# Patient Record
Sex: Female | Born: 1990 | Race: White | Hispanic: No | Marital: Single | Smoking: Current every day smoker
Health system: Southern US, Community
[De-identification: ages and names within clinical notes are randomized; demographics above are authoritative.]

## PROBLEM LIST (undated history)

## (undated) DIAGNOSIS — R7303 Prediabetes: Secondary | ICD-10-CM

## (undated) DIAGNOSIS — R12 Heartburn: Secondary | ICD-10-CM

## (undated) DIAGNOSIS — Z8619 Personal history of other infectious and parasitic diseases: Secondary | ICD-10-CM

## (undated) DIAGNOSIS — F32A Depression, unspecified: Secondary | ICD-10-CM

## (undated) HISTORY — DX: Prediabetes: R73.03

## (undated) HISTORY — DX: Heartburn: R12

## (undated) HISTORY — DX: Personal history of other infectious and parasitic diseases: Z86.19

## (undated) HISTORY — DX: Depression, unspecified: F32.A

---
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
## 2012-06-15 IMAGING — CR DG FOOT COMPLETE 3+V*R*
3 series · 3 of 3 positions shown · non-contrast
Comparison: None.

CLINICAL DATA: Stepped on something, pain

RIGHT FOOT COMPLETE - 3+ VIEW

[t foot ap right]
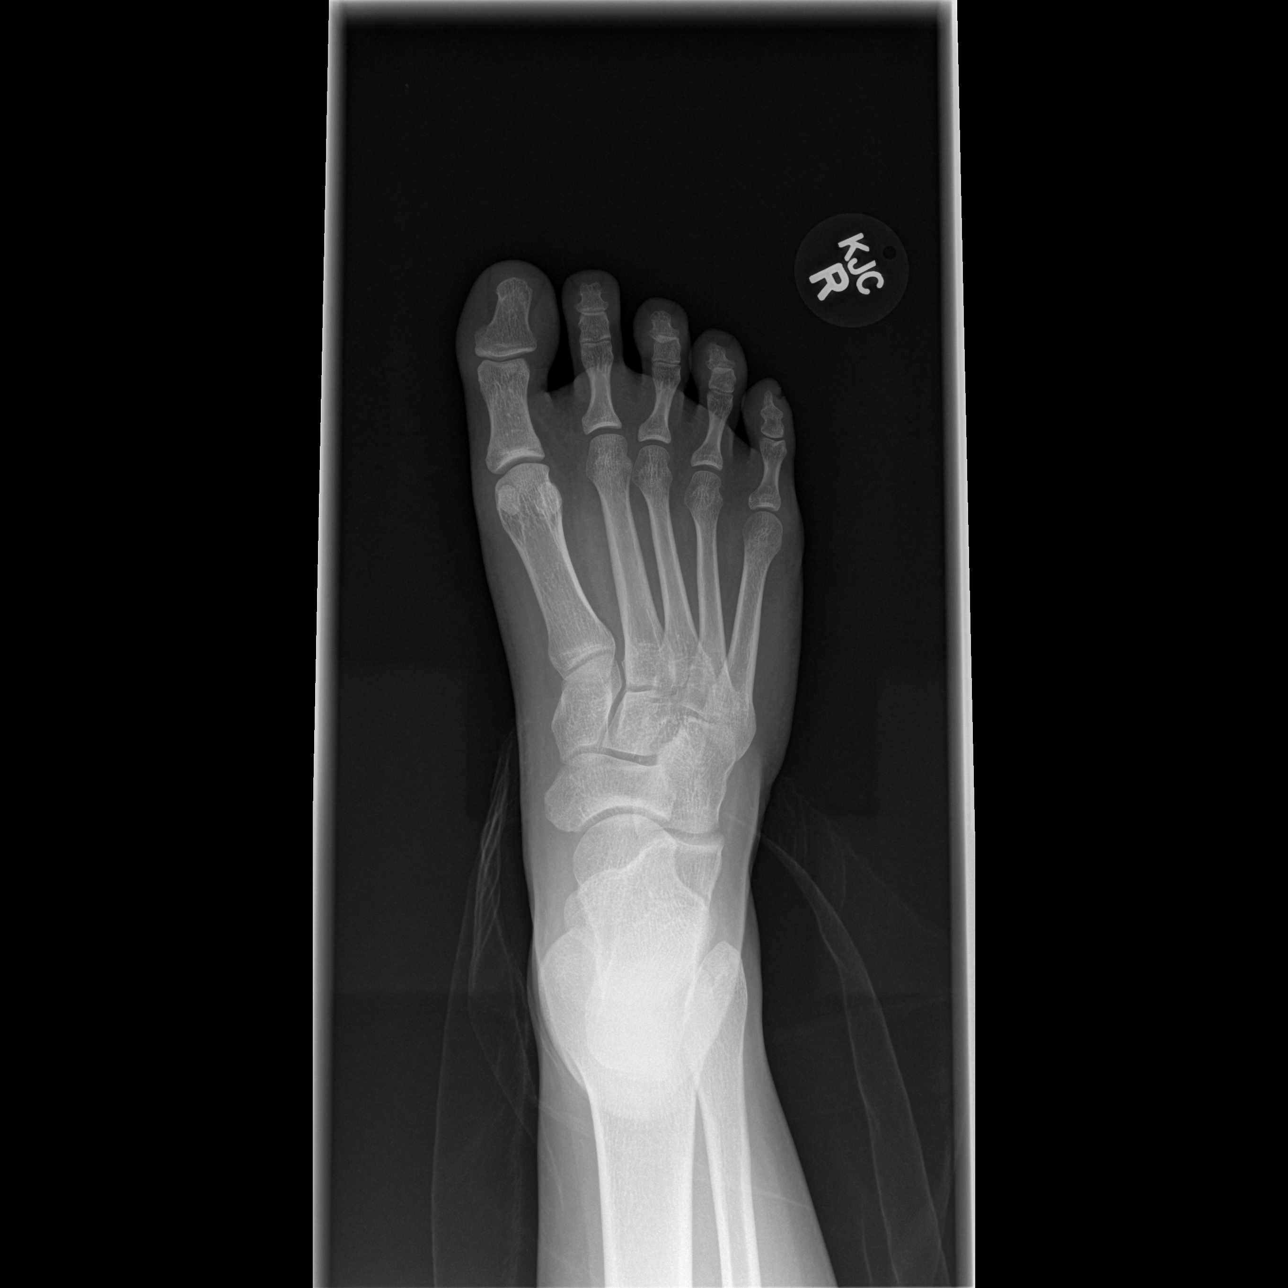

[t foot oblique right]
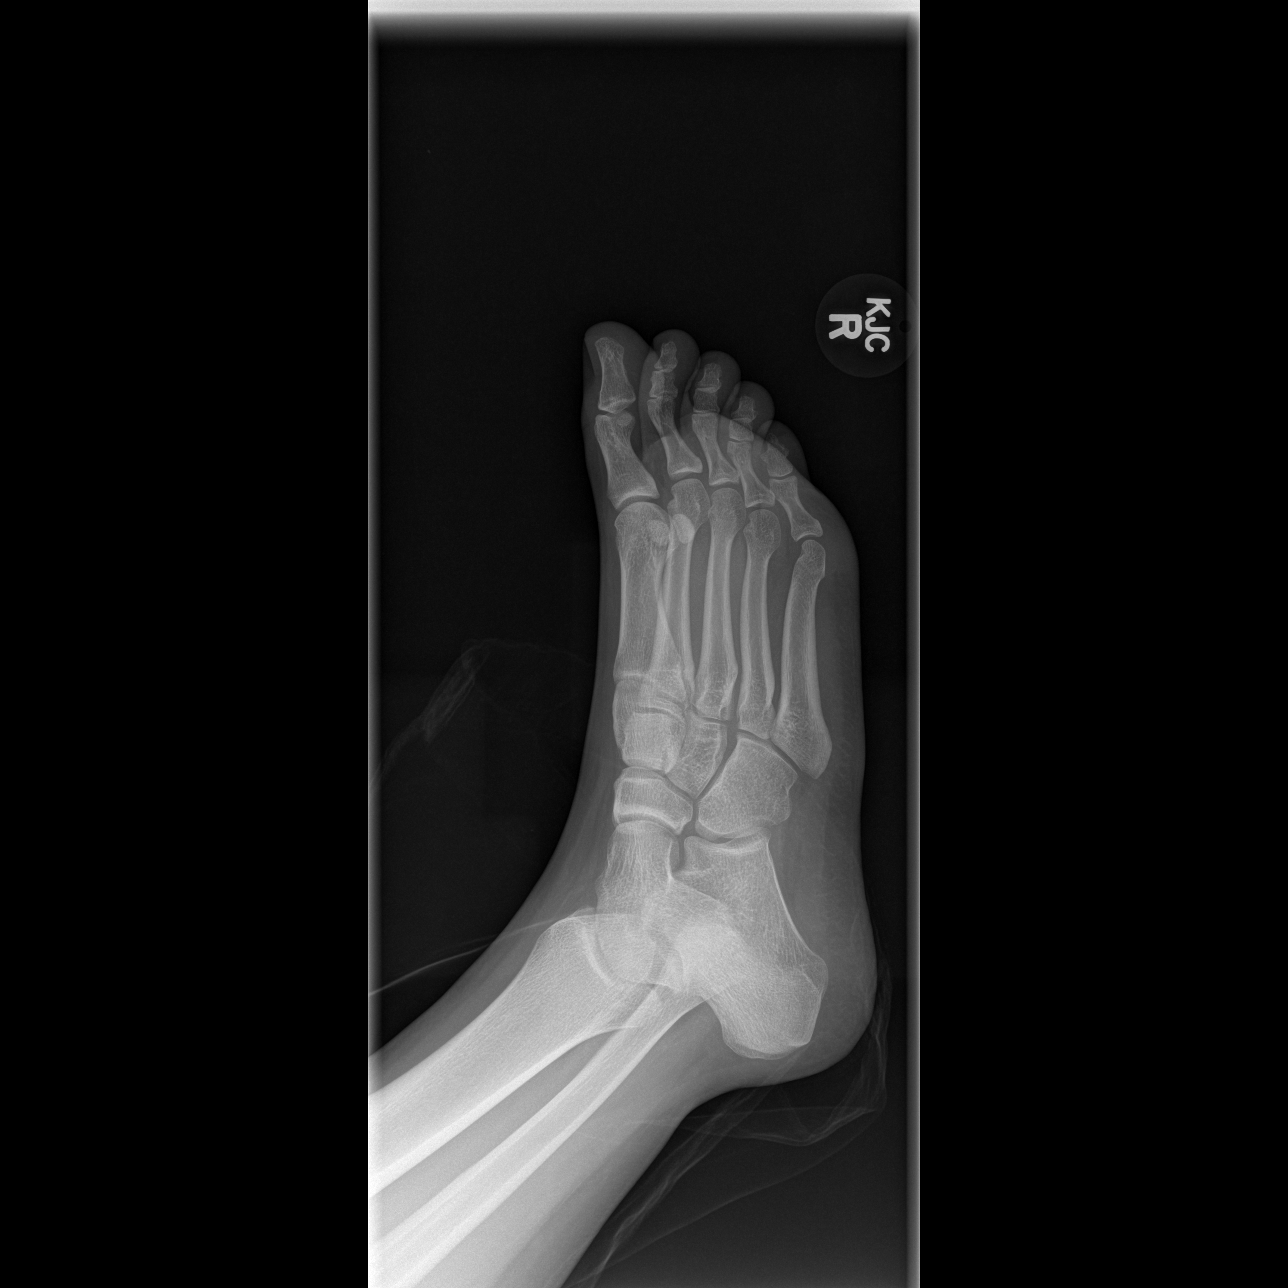

[t foot lat right]
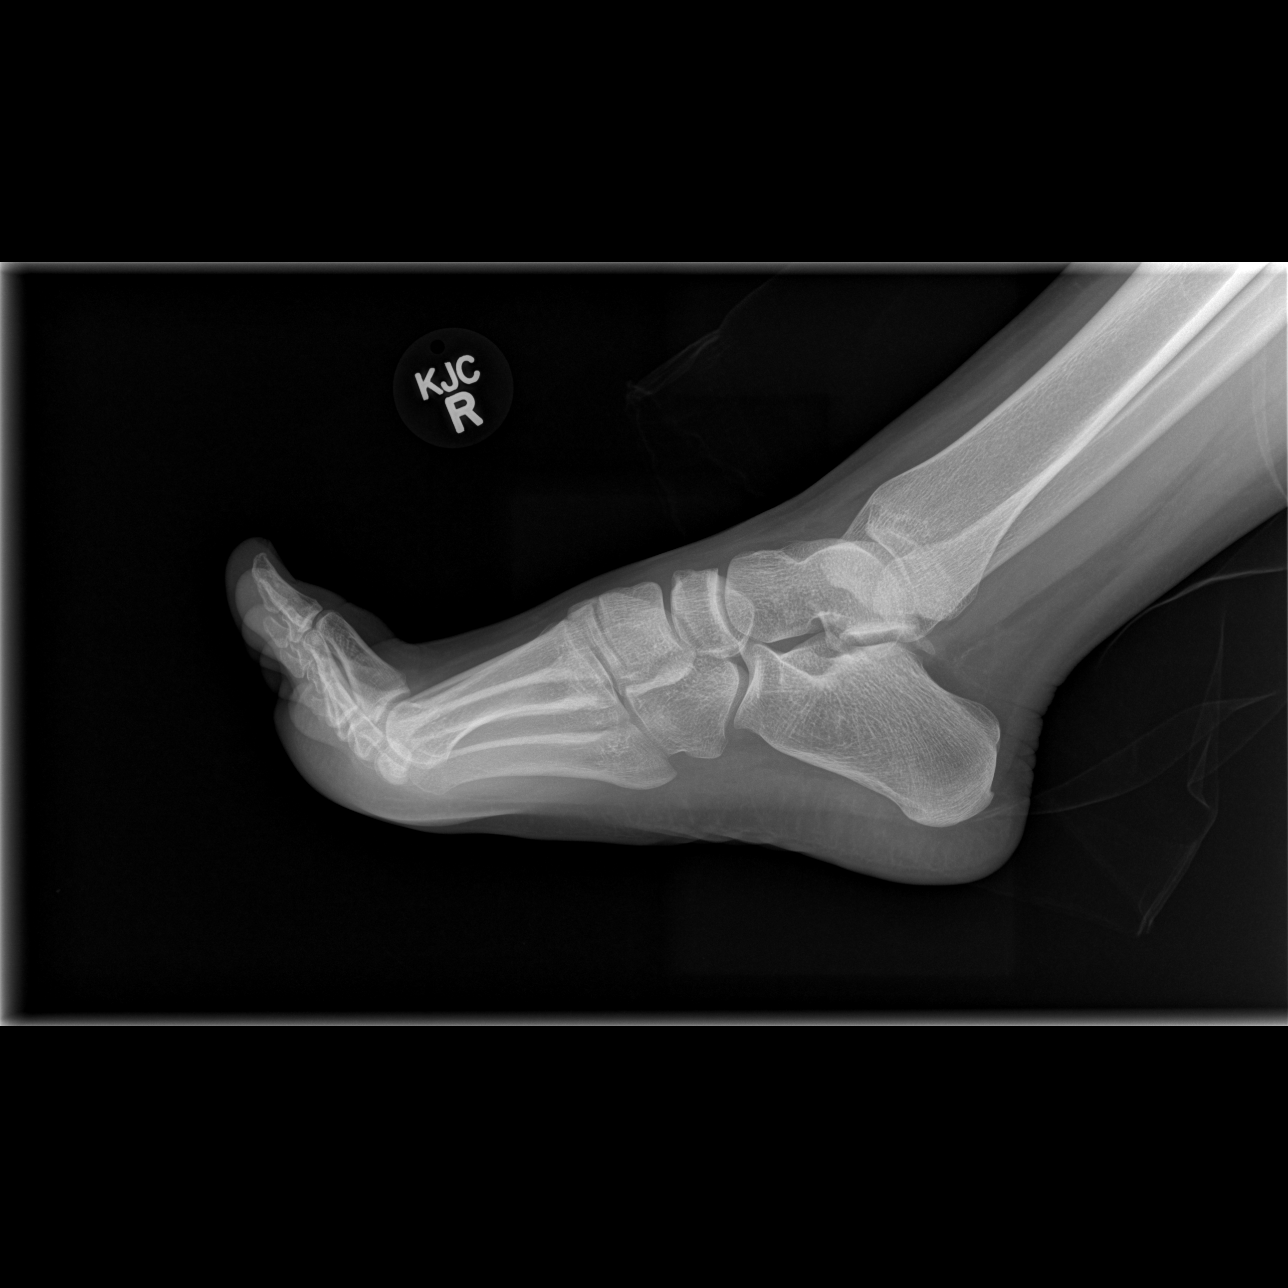

[3 of 3 positions shown; findings below may reference images not displayed]

FINDINGS: Tarsal - metatarsal alignment is normal.  No opaque
foreign body is seen.  Joint spaces appear normal.
IMPRESSION: The negative right foot.

## 2013-04-27 ENCOUNTER — Encounter (HOSPITAL_BASED_OUTPATIENT_CLINIC_OR_DEPARTMENT_OTHER): Payer: Self-pay | Admitting: Emergency Medicine

## 2013-04-27 ENCOUNTER — Emergency Department (HOSPITAL_BASED_OUTPATIENT_CLINIC_OR_DEPARTMENT_OTHER)
Admission: EM | Admit: 2013-04-27 | Discharge: 2013-04-27 | Disposition: A | Discharge status: Home / Self Care | Payer: Self-pay | Attending: Emergency Medicine | Admitting: Emergency Medicine

## 2013-04-27 DIAGNOSIS — F17200 Nicotine dependence, unspecified, uncomplicated: Secondary | ICD-10-CM | POA: Insufficient documentation

## 2013-04-27 DIAGNOSIS — L909 Atrophic disorder of skin, unspecified: Secondary | ICD-10-CM | POA: Insufficient documentation

## 2013-04-27 DIAGNOSIS — L918 Other hypertrophic disorders of the skin: Secondary | ICD-10-CM

## 2013-04-27 DIAGNOSIS — Z79899 Other long term (current) drug therapy: Secondary | ICD-10-CM | POA: Insufficient documentation

## 2013-04-27 NOTE — ED Provider Notes (Signed)
Acrochrodon Removal  Patient had a 3 mm skin tag to the right lateral neck. Area was cleaned using alcohol swabs. Skin tag removed using forceps.  Pt tolerated procedure well without complications.    Layla Maw Ward, DO 04/27/13 1417

## 2013-04-27 NOTE — ED Notes (Signed)
Pt reports yesterday mole in neck began swelling and bleeding

## 2013-04-27 NOTE — ED Provider Notes (Signed)
TIME SEEN: 2:10 PM  CHIEF COMPLAINT: "I have a mole on the side of my neck that is hurting and swelling"  HPI: Patient is a 22 y.o. female with no significant past medical history who presents emergency department with a skin tag to the right lateral neck that has been causing the patient to have some pain. She has noticed that it is become darker than normal and she's had a small amount of blood coming from this area. No erythema, warmth or purulent drainage. No fever. No history of injury to this area.  ROS: See HPI Constitutional: no fever  Eyes: no drainage  ENT: no runny nose   Cardiovascular:  no chest pain  Resp: no SOB  GI: no vomiting GU: no dysuria Integumentary: no rash  Allergy: no hives  Musculoskeletal: no leg swelling  Neurological: no slurred speech ROS otherwise negative  PAST MEDICAL HISTORY/PAST SURGICAL HISTORY:  History reviewed. No pertinent past medical history.  MEDICATIONS:  Prior to Admission medications   Medication Sig Start Date End Date Taking? Authorizing Provider  norgestimate-ethinyl estradiol (ORTHO-CYCLEN,SPRINTEC,PREVIFEM) 0.25-35 MG-MCG tablet Take 1 tablet by mouth daily.   Yes Historical Provider, MD    ALLERGIES:  No Known Allergies  SOCIAL HISTORY:  History  Substance Use Topics  . Smoking status: Current Every Day Smoker -- 0.50 packs/day    Types: Cigarettes  . Smokeless tobacco: Not on file  . Alcohol Use: Not on file    FAMILY HISTORY: History reviewed. No pertinent family history.  EXAM: BP 125/60  Pulse 75  Temp(Src) 98.8 F (37.1 C) (Oral)  Resp 20  Ht 5\' 5"  (1.651 m)  Wt 141 lb (63.957 kg)  BMI 23.46 kg/m2  SpO2 100%  LMP 03/28/2013 CONSTITUTIONAL: Alert and oriented and responds appropriately to questions. Well-appearing; well-nourished HEAD: Normocephalic EYES: Conjunctivae clear, PERRL ENT: normal nose; no rhinorrhea; moist mucous membranes; pharynx without lesions noted NECK: Supple, no meningismus, no  LAD; patient has a 3 mm skin tag her right lateral neck that appears dark with no surrounding erythema, warmth, induration, fluctuance, drainage CARD: RRR; S1 and S2 appreciated; no murmurs, no clicks, no rubs, no gallops RESP: Normal chest excursion without splinting or tachypnea; breath sounds clear and equal bilaterally; no wheezes, no rhonchi, no rales,  ABD/GI: Normal bowel sounds; non-distended; soft, non-tender, no rebound, no guarding BACK:  The back appears normal and is non-tender to palpation, there is no CVA tenderness EXT: Normal ROM in all joints; non-tender to palpation; no edema; normal capillary refill; no cyanosis    SKIN: Normal color for age and race; warm NEURO: Moves all extremities equally PSYCH: The patient's mood and manner are appropriate. Grooming and personal hygiene are appropriate.  MEDICAL DECISION MAKING: Patient with a small skin tag is causing her pain. No associated signs of infection. She is asked for me to remove the skin tag. When I pulled the skin tag with forceps immediately pulled off from her neck without any bleeding or other complications. Will have her followup with her primary care physician to have this area rechecked in one to 2 weeks. I am not concerned for melanoma or any other skin cancer. Given return precautions. Patient verbalizes understanding and is comfortable plan.      Layla Maw Ward, DO 04/27/13 1413

## 2014-03-31 ENCOUNTER — Encounter (HOSPITAL_COMMUNITY): Payer: Self-pay | Admitting: Emergency Medicine

## 2014-03-31 ENCOUNTER — Emergency Department (HOSPITAL_COMMUNITY)
Admission: EM | Admit: 2014-03-31 | Discharge: 2014-04-01 | Disposition: A | Discharge status: Home / Self Care | Attending: Emergency Medicine | Admitting: Emergency Medicine

## 2014-03-31 DIAGNOSIS — F17200 Nicotine dependence, unspecified, uncomplicated: Secondary | ICD-10-CM | POA: Diagnosis not present

## 2014-03-31 DIAGNOSIS — F410 Panic disorder [episodic paroxysmal anxiety] without agoraphobia: Secondary | ICD-10-CM | POA: Diagnosis not present

## 2014-03-31 DIAGNOSIS — F419 Anxiety disorder, unspecified: Secondary | ICD-10-CM | POA: Diagnosis present

## 2014-03-31 DIAGNOSIS — R45851 Suicidal ideations: Secondary | ICD-10-CM

## 2014-03-31 DIAGNOSIS — Z008 Encounter for other general examination: Secondary | ICD-10-CM | POA: Diagnosis present

## 2014-03-31 DIAGNOSIS — Z79899 Other long term (current) drug therapy: Secondary | ICD-10-CM | POA: Insufficient documentation

## 2014-03-31 DIAGNOSIS — F40298 Other specified phobia: Secondary | ICD-10-CM | POA: Diagnosis not present

## 2014-03-31 DIAGNOSIS — F411 Generalized anxiety disorder: Secondary | ICD-10-CM | POA: Diagnosis not present

## 2014-03-31 LAB — ETHANOL: Alcohol, Ethyl (B): <11 mg/dL (ref 0–11)

## 2014-03-31 LAB — BASIC METABOLIC PANEL
ANION GAP: 13 (ref 5–15)
BUN: 9 mg/dL (ref 6–23)
CALCIUM: 9.5 mg/dL (ref 8.4–10.5)
CO2: 25 meq/L (ref 19–32)
CREATININE: 0.68 mg/dL (ref 0.50–1.10)
Chloride: 102 mEq/L (ref 96–112)
Glucose, Bld: 101 mg/dL — ABNORMAL HIGH (ref 70–99)
Potassium: 3.9 mEq/L (ref 3.7–5.3)
SODIUM: 140 meq/L (ref 137–147)

## 2014-03-31 LAB — RAPID URINE DRUG SCREEN, HOSP PERFORMED
AMPHETAMINES: NOT DETECTED
BARBITURATES: NOT DETECTED
Benzodiazepines: NOT DETECTED
Cocaine: NOT DETECTED
OPIATES: NOT DETECTED
TETRAHYDROCANNABINOL: NOT DETECTED

## 2014-03-31 LAB — ACETAMINOPHEN LEVEL

## 2014-03-31 LAB — CBC WITH DIFFERENTIAL/PLATELET
BASOS ABS: 0 10*3/uL (ref 0.0–0.1)
BASOS PCT: 0 % (ref 0–1)
EOS ABS: 0.3 10*3/uL (ref 0.0–0.7)
EOS PCT: 5 % (ref 0–5)
HEMATOCRIT: 40.8 % (ref 36.0–46.0)
Hemoglobin: 13.1 g/dL (ref 12.0–15.0)
Lymphocytes Relative: 33 % (ref 12–46)
Lymphs Abs: 1.8 10*3/uL (ref 0.7–4.0)
MCH: 26.4 pg (ref 26.0–34.0)
MCHC: 32.1 g/dL (ref 30.0–36.0)
MCV: 82.3 fL (ref 78.0–100.0)
MONO ABS: 0.4 10*3/uL (ref 0.1–1.0)
Monocytes Relative: 7 % (ref 3–12)
Neutro Abs: 3 10*3/uL (ref 1.7–7.7)
Neutrophils Relative %: 55 % (ref 43–77)
PLATELETS: 318 10*3/uL (ref 150–400)
RBC: 4.96 MIL/uL (ref 3.87–5.11)
RDW: 15 % (ref 11.5–15.5)
WBC: 5.4 10*3/uL (ref 4.0–10.5)

## 2014-03-31 LAB — SALICYLATE LEVEL: Salicylate Lvl: <2 mg/dL — ABNORMAL LOW (ref 2.8–20.0)

## 2014-03-31 MED ORDER — LORAZEPAM 1 MG PO TABS: 1.0000 mg | ORAL_TABLET | Freq: Three times a day (TID) | ORAL | Status: DC | PRN

## 2014-03-31 NOTE — ED Provider Notes (Signed)
Medical screening examination/treatment/procedure(s) were performed by non-physician practitioner and as supervising physician I was immediately available for consultation/collaboration.   EKG Interpretation None      Devoria Albe, MD, Armando Gang   Ward Givens, MD 03/31/14 [phone]

## 2014-03-31 NOTE — ED Provider Notes (Signed)
Jarett Dralle S 8:00PM patient discussed in signout. Patient with SI, pending laboratory medical clearance.  9:30 PM laboratory testing unremarkable. Patient is medically cleared. TTS has evaluated patient and she is now denying SI to them. Given that she is AWOL with significant fears of returning and was expressing SI will recommend that she continues to have psychiatry evaluation and clearance.  Angus Seller, PA-C 03/31/14 2132

## 2014-03-31 NOTE — ED Notes (Addendum)
Pt presents to ed requesting medical clearance. 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 Pt sts she gets extremely anxious when on ship and starts having suicidal thoughts when on boat. Pt sts she is being evaluated by [facility] hospital and needs one more psych evaluation in order for them to keep her on shore instead of ship. Pt denies SI at this time. Pt sts, when on ship she gets very claustrophobic, anxious, start sweating profusely, gets short of breath and wants to kill herself. Pt also reports her mother is currently hospitalized for suicide attempt. Pt  stated that if we don't  help her she will kill herself if put back on ship, by jumping from the boat.
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
## 2014-03-31 NOTE — ED Provider Notes (Signed)
CSN: [id number]     Arrival date & time 03/31/14  1759 History   First MD Initiated Contact with Patient 03/31/14 1854     Chief Complaint  Patient presents with  . Medical Clearance   The history is provided by the patient. No language interpreter was used.   This chart was scribed for non-physician practitioner Ivar Drape, PA-C working with Ward Givens, MD, by Andrew Au, ED Scribe. This patient was seen in room WTR4/[facility]PT4 and the patient's care was started at 6:54 PM.  Katrina Payne is a 23 y.o. female who presents to the Emergency Department for medical clearance. 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 She reports when she reported to duty on ship she was experiencing claustrophobia and anxiety due to small, tight spaces as well as the inability to sleep. Pt states she did not receive medication for symptoms. Pt states she was taken off the ship for a short period of time but had to return back on the ship. She reports when she returned she was experiencing suicidal ideation. Per step mother she has an Pensions consultant. She reports pt has been consider AWOL for not returning due to anxiety. She reports she is in need of medical paperwork for honorable discharge. Pt denies other health problems. Pt denies self harm.  History reviewed. No pertinent past medical history. History reviewed. No pertinent past surgical history. No family history on file. History  Substance Use Topics  . Smoking status: Current Every Day Smoker -- 0.50 packs/day    Types: Cigarettes  . Smokeless tobacco: Not on file  . Alcohol Use: Not on file   OB History   Grav Para Term Preterm Abortions TAB SAB Ect Mult Living                 Review of Systems  Constitutional: Negative for fever and chills.  Respiratory: Negative for shortness of breath.   Cardiovascular: Negative for chest pain.  Gastrointestinal: Negative for nausea, vomiting,  diarrhea and constipation.  Genitourinary: Negative for dysuria.  Psychiatric/Behavioral: Positive for suicidal ideas. Negative for self-injury. The patient is nervous/anxious.   All other systems reviewed and are negative.   Allergies  Review of patient's allergies indicates no known allergies.  Home Medications   Prior to Admission medications   Medication Sig Start Date End Date Taking? Authorizing Provider  norgestimate-ethinyl estradiol (ORTHO-CYCLEN,SPRINTEC,PREVIFEM) 0.25-35 MG-MCG tablet Take 1 tablet by mouth daily.   Yes Historical Provider, MD   BP 140/80  Pulse 98  Temp(Src) 98.1 F (36.7 C) (Oral)  Resp 16  SpO2 98%  LMP 02/28/2014 Physical Exam  Nursing note and vitals reviewed. Constitutional: She is oriented to person, place, and time. She appears well-developed and well-nourished. No distress.  HENT:  Head: Normocephalic and atraumatic.  Eyes: Conjunctivae and EOM are normal. Pupils are equal, round, and reactive to light.  Neck: Normal range of motion. Neck supple.  Cardiovascular: Normal rate and regular rhythm.  Exam reveals no gallop and no friction rub.   No murmur heard. Pulmonary/Chest: Effort normal and breath sounds normal. No respiratory distress. She has no wheezes. She has no rales. She exhibits no tenderness.  Abdominal: Soft. Bowel sounds are normal. She exhibits no distension and no mass. There is no tenderness. There is no rebound and no guarding.  Musculoskeletal: Normal range of motion. She exhibits no edema and no tenderness.  Neurological: She is alert and oriented to person, place, and time.  Skin: Skin is warm and dry.  Psychiatric: She has a normal mood and affect. Her behavior is normal. Judgment and thought content normal.    ED Course  Procedures (including critical care time) DIAGNOSTIC STUDIES: Oxygen Saturation is 98% on RA, normal by my interpretation.    Labs Review Labs Reviewed - No data to display  Imaging Review No  results found.   EKG Interpretation None      MDM   Final diagnoses:  None   Patient with SI 2/2 anxiety and claustrophobia.  No active plan.   Will check labs.  TTS consult is pending.   7:54 PM Patient signed out to [facility], PA-C, who will follow-up on labs, which I anticipate to be normal.  Medically clear pending normal labs.  I personally performed the services described in this documentation, which was scribed in my presence. The recorded information has been reviewed and is accurate.      Roxy Horseman, PA-C 03/31/14 1956

## 2014-03-31 NOTE — ED Notes (Signed)
Pt in scrubs. Pt. And belongings searched and wanded by security. Pt. Has black tennis shoes, black [place] shorts, black hat, white sports bra and white t-shirt, glasses( pt. Has glasses at bedside). Pt. Belongings locked up at the nurses station in triage.

## 2014-03-31 NOTE — ED Notes (Signed)
Pt. To [facility]PU from ED ambulatory without difficulty. Report from Autumn RN. Pt. Is alert and oriented, warm and dry in no distress. Pt. Denies SI, HI, and AVH. Pt. Calm and cooperative. Pt. Encouraged to let Nursing staff know if he has concerns or needs.

## 2014-03-31 NOTE — BH Assessment (Signed)
Assessment completed. Consulted Dr. Jannifer Franklin who recommended that patient be discharged due pt not meeting inpatient criteria. Pt denies SI, HI and AVH at this time. Informed Ivonne Andrew, PA-C who requested that patient be evaluated by psychiatry in the morning. Pt will remain in the ED and receive a psychiatric evaluation in the morning.

## 2014-03-31 NOTE — ED Provider Notes (Signed)
Medical screening examination/treatment/procedure(s) were performed by non-physician practitioner and as supervising physician I was immediately available for consultation/collaboration.   EKG Interpretation None      Devoria Albe, MD, Armando Gang   Ward Givens, MD 03/31/14 2144

## 2014-04-01 ENCOUNTER — Encounter (HOSPITAL_COMMUNITY): Payer: Self-pay | Admitting: Psychiatry

## 2014-04-01 DIAGNOSIS — F419 Anxiety disorder, unspecified: Secondary | ICD-10-CM | POA: Diagnosis present

## 2014-04-01 DIAGNOSIS — F410 Panic disorder [episodic paroxysmal anxiety] without agoraphobia: Secondary | ICD-10-CM | POA: Diagnosis present

## 2014-04-01 DIAGNOSIS — F411 Generalized anxiety disorder: Secondary | ICD-10-CM

## 2014-04-01 DIAGNOSIS — R45851 Suicidal ideations: Secondary | ICD-10-CM

## 2014-04-01 NOTE — ED Notes (Signed)
Friend in w/ patient 

## 2014-04-01 NOTE — ED Notes (Signed)
Dr Ross and Jamison NP into see 

## 2014-04-01 NOTE — ED Notes (Addendum)
Written dc instructions reviewed w/ patient.  Pt encouraged to return/seek treatment for suicidal/homicidal thoughts or urges and to follow up Tuesday as scheduled.  Pt verbalized understanding.  Pt ambulatory w/o difficulty to dc window, belongings returned after leaving the unit.

## 2014-04-01 NOTE — [facility] Suicide Risk Assessment (Signed)
Suicide Risk Assessment  Discharge Assessment     Demographic Factors:  Adolescent or young adult and Caucasian  Total Time spent with patient: 20 minutes  Psychiatric Specialty Exam:     Blood pressure 110/62, pulse 70, temperature 98.3 F (36.8 C), temperature source Oral, resp. rate 18, last menstrual period 02/28/2014, SpO2 99.00%.There is no weight on file to calculate BMI.  General Appearance: Casual  Eye Contact::  Good  Speech:  Normal Rate  Volume:  Normal  Mood:  Anxious  Affect:  Congruent  Thought Process:  Coherent  Orientation:  Full (Time, Place, and Person)  Thought Content:  WDL  Suicidal Thoughts:  No  Homicidal Thoughts:  No  Memory:  Immediate;   Good Recent;   Good Remote;   Good  Judgement:  Fair  Insight:  Fair  Psychomotor Activity:  Normal  Concentration:  Good  Recall:  Good  Fund of Knowledge:Good  Language: Good  Akathisia:  No  Handed:  Right  AIMS (if indicated):     Assets:  Communication Skills Desire for Improvement Financial Resources/Insurance Housing Leisure Time Physical Health Resilience Social Support Transportation  Sleep:      Musculoskeletal: Strength & Muscle Tone: within normal limits Gait & Station: normal Patient leans: N/A  Mental Status Per Nursing Assessment::   On Admission:   Suicidal ideations, panic attack  Current Mental Status by Physician: NA  Loss Factors: NA  Historical Factors: NA  Risk Reduction Factors:   Sense of responsibility to family, Living with another person, especially a relative, Positive social support and Positive therapeutic relationship  Continued Clinical Symptoms:  Anxiety  Cognitive Features That Contribute To Risk:  None  Suicide Risk:  Minimal: No identifiable suicidal ideation.  Patients presenting with no risk factors but with morbid ruminations; may be classified as minimal risk based on the severity of the depressive symptoms  Discharge Diagnoses:   AXIS  I:  Anxiety Disorder NOS and Panic Disorder AXIS II:  Deferred AXIS III:  History reviewed. No pertinent past medical history. AXIS IV:  occupational problems AXIS V:  61-70 mild symptoms  Plan Of Care/Follow-up recommendations:  Activity:  as tolerated Diet:  low-sodium heart healthy diet  Is patient on multiple antipsychotic therapies at discharge:  No   Has Patient had three or more failed trials of antipsychotic monotherapy by history:  No  Recommended Plan for Multiple Antipsychotic Therapies: NA    Patriciann Becht, PMH-NP 04/01/2014, 11:21 AM

## 2014-04-01 NOTE — ED Notes (Addendum)
Pt's mother into see and is requesting to speak to the Dr. Concerning why she brought the patient here, and patients hx, will relay request

## 2014-04-01 NOTE — ED Notes (Signed)
Up to the bathroom 

## 2014-04-01 NOTE — Progress Notes (Signed)
Katrina Payne presented to the Emergency Department with anxiety, panic attack, and suicidal ideations on 03/31/2014.  She is currently not suicidal and is stable for discharge.  [name] has an appointment with a psychiatrist on Tuesday, Sept. 8.  Jamison Y. Shaune Pollack, PMH-NP Dr. Diannia Ruder, MD
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
## 2020-10-25 ENCOUNTER — Ambulatory Visit
Admission: RE | Admit: 2020-10-25 | Discharge: 2020-10-25 | Disposition: A | Discharge status: Home / Self Care | Payer: 59 | Source: Ambulatory Visit | Attending: Family Medicine | Admitting: Family Medicine

## 2020-10-25 ENCOUNTER — Other Ambulatory Visit: Payer: Self-pay

## 2020-10-25 VITALS — BP 115/76 | HR 94 | Temp 98.2°F | Resp 20

## 2020-10-25 DIAGNOSIS — M549 Dorsalgia, unspecified: Secondary | ICD-10-CM | POA: Diagnosis not present

## 2020-10-25 MED ORDER — DICLOFENAC SODIUM 75 MG PO TBEC: 75.0000 mg | DELAYED_RELEASE_TABLET | Freq: Two times a day (BID) | ORAL | 0 refills | Status: DC

## 2020-10-25 NOTE — ED Triage Notes (Signed)
Pt c/o lt shoulder blade pain radiating to lt elbow x3wks, now having numbness to lt hand. Denies injury but states lifts weights and types all day.

## 2020-10-30 NOTE — ED Provider Notes (Signed)
  [facility]   [id number] 10/25/20 Arrival Time: 1834  ASSESSMENT & PLAN:  1. Upper back pain on left side     Discussed pain likely secondary to muscle strain. Able to ambulate here and hemodynamically stable. No indication for imaging of back at this time given no trauma and normal neurological exam. Discussed.  Meds ordered this encounter  Medications  . diclofenac (VOLTAREN) 75 MG EC tablet    Sig: Take 1 tablet (75 mg total) by mouth 2 (two) times daily.    Dispense:  14 tablet    Refill:  0    Medication sedation precautions given. Encourage ROM/movement as tolerated.  Recommend:  Follow-up Information    [facility].   Why: If worsening or failing to improve as anticipated. Contact information: [address] [place] [address] [phone]              Reviewed expectations re: course of current medical issues. Questions answered. Outlined signs and symptoms indicating need for more acute intervention. Patient verbalized understanding. After Visit Summary given.   SUBJECTIVE: History from: patient.  Katrina Payne is a 30 y.o. female who presents with L upper back discomfort. No injury/trauma. Does radiate to L shoulder. No CP/SOB. Occasional tingling feeling of L hand; few seconds then resolves; notes with certain movements. No neck pain or swelling. Lifts heavy objects at work regularly.  Reports no chronic steroid use, fevers, IV drug use, or recent back surgeries or procedures.   OBJECTIVE:  Vitals:   10/25/20 1850  BP: 115/76  Pulse: 94  Resp: 20  Temp: 98.2 F (36.8 C)  TempSrc: Oral  SpO2: 98%    General appearance: alert; no distress HEENT: [place]; AT Neck: supple with FROM; without midline tenderness CV: regular Lungs: unlabored respirations; speaks full sentences without difficulty Abdomen: soft, non-tender; non-distended Back: L upper back with TTP over trapezius  distribution Extremities: without edema; symmetrical without gross deformities; normal ROM of bilateral UE Skin: warm and dry Neurologic: normal gait; normal sensation and strength of bilateral UE Psychological: alert and cooperative; normal mood and affect   No Known Allergies  History reviewed. No pertinent past medical history. Social History   Socioeconomic History  . Marital status: Single    Spouse name: Not on file  . Number of children: Not on file  . Years of education: Not on file  . Highest education level: Not on file  Occupational History  . Not on file  Tobacco Use  . Smoking status: Current Every Day Smoker    Packs/day: 0.50    Types: Cigarettes  . Smokeless tobacco: Never Used  Substance and Sexual Activity  . Alcohol use: Not Currently  . Drug use: No  . Sexual activity: Not on file  Other Topics Concern  . Not on file  Social History Narrative  . Not on file   Social Determinants of Health   Financial Resource Strain: Not on file  Food Insecurity: Not on file  Transportation Needs: Not on file  Physical Activity: Not on file  Stress: Not on file  Social Connections: Not on file  Intimate Partner Violence: Not on file   History reviewed. No pertinent family history. History reviewed. No pertinent surgical history.   Mardella Layman, MD 10/30/20 1026

## 2020-11-12 ENCOUNTER — Ambulatory Visit (INDEPENDENT_AMBULATORY_CARE_PROVIDER_SITE_OTHER): Payer: 59 | Admitting: Sports Medicine

## 2020-11-12 ENCOUNTER — Other Ambulatory Visit: Payer: Self-pay

## 2020-11-12 VITALS — Ht 65.0 in | Wt 158.0 lb

## 2020-11-12 DIAGNOSIS — M5010 Cervical disc disorder with radiculopathy, unspecified cervical region: Secondary | ICD-10-CM | POA: Diagnosis not present

## 2020-11-13 ENCOUNTER — Encounter: Payer: Self-pay | Admitting: Sports Medicine

## 2020-11-13 NOTE — Progress Notes (Signed)
   Subjective:    Patient ID: Katrina Payne, female    DOB: 1991/03/24, 30 y.o.   MRN: [id number]  HPI chief complaint: Left shoulder pain  Very pleasant 30 year old right-hand-dominant female comes in today complaining of left shoulder plain that she localizes to the left scapular area.  Approximately a month ago she began to notice some pain after working out.  Specifically, she was doing dumbbell triceps extensions behind her head.  No pain at the time, but she awoke the next day with discomfort around the left scapula.  She was also experiencing diffuse numbness down the left arm and into the left side of her face.  When symptoms persisted, she was seen at a local urgent care.  She was diagnosed with a muscle strain and placed on diclofenac.  She had to stop the diclofenac secondary to side effects.  Since that time, she has purchased a more ergonomic back support for her office chair and is started taking ibuprofen as needed.  The combination of these things has improved her symptoms.  In fact today she is relatively symptom-free.  She denies any similar problems in the past.  She denies any weakness.  Past medical history reviewed Medications reviewed Allergies reviewed    Review of Systems    As above Objective:   Physical Exam  Well-developed, well-nourished.  No acute distress  Left shoulder: Full painless range of motion.  No deformity.  There are some slight tenderness to palpation along the parascapular musculature.  No scapular winging.  Neurological exam shows 5/5 strength in both upper extremities.  No atrophy.  Reflexes are equal at the biceps, triceps, and brachioradialis tendons bilaterally.  Good pulses.      Assessment & Plan:   Resolved left scapular pain and left arm numbness likely secondary to cervical radiculopathy  Since the patient's symptoms have resolved, I do not feel the need to work this up further.  However, if symptoms return, I would consider a  cervical spine x-ray, formal physical therapy, and possibly a short course of oral steroids.  I did caution her about returning to any exercise that causes returning discomfort to the scapular area.  Otherwise, she may start to increase activities as tolerated and will follow up with me for ongoing or recalcitrant issues.

## 2020-11-26 ENCOUNTER — Other Ambulatory Visit: Payer: Self-pay | Admitting: *Deleted

## 2020-11-26 DIAGNOSIS — M5010 Cervical disc disorder with radiculopathy, unspecified cervical region: Secondary | ICD-10-CM

## 2020-11-29 ENCOUNTER — Encounter: Payer: Self-pay | Admitting: Sports Medicine

## 2020-11-29 ENCOUNTER — Telehealth: Payer: Self-pay | Admitting: Sports Medicine

## 2020-11-29 NOTE — Telephone Encounter (Signed)
  Patient called several days ago with persistent neck pain.  I recommended that we get some cervical spine films which were done at [facility].  I personally reviewed those films and they are unremarkable.  I recommended that she proceed with physical therapy and follow-up if symptoms persist thereafter.

## 2020-12-06 ENCOUNTER — Ambulatory Visit: Payer: 59 | Attending: Sports Medicine

## 2020-12-06 ENCOUNTER — Other Ambulatory Visit: Payer: Self-pay

## 2020-12-06 DIAGNOSIS — G540 Brachial plexus disorders: Secondary | ICD-10-CM | POA: Insufficient documentation

## 2020-12-06 DIAGNOSIS — M5010 Cervical disc disorder with radiculopathy, unspecified cervical region: Secondary | ICD-10-CM | POA: Insufficient documentation

## 2020-12-08 NOTE — Therapy (Addendum)
[place], [place], [address] Phone: [phone]   Fax:  [phone]  Physical Therapy Evaluation/ DISCHARGE  Patient Details  Name: Katrina Payne MRN: [id number] Date of Birth: [birth year] Referring Provider (PT): Thurman Coyer, DO   Encounter Date: 12/06/2020    History reviewed. No pertinent past medical history.  History reviewed. No pertinent surgical history.  There were no vitals filed for this visit.                    Objective measurements completed on examination: See above findings.                 PT Short Term Goals - 12/07/20 2351      PT SHORT TERM GOAL #1   Title Pt will be Ind in an initial HEP    Baseline started on eval    Status New    Target Date 12/28/20      PT SHORT TERM GOAL #2   Title Pt will voice understanding of measures to reduce symptoms    Status New    Target Date 12/28/20      PT SHORT TERM GOAL #3   Title Pt will be able to demonstrate proper sitting posture    Status New    Target Date 12/28/20             PT Long Term Goals - 12/07/20 2353      PT LONG TERM GOAL #1   Title Pt will report a reduction of symptoms with work and daily activities to where she she primarily symptom free except for occasional incidences    Status New    Target Date 01/26/21      PT LONG TERM GOAL #2   Title Pt's functional FOTO score will improve to the predicted value of 85% ability    Baseline 76%    Status New    Target Date 01/26/21      PT LONG TERM GOAL #3   Title Pt will be Ind in a final HEP to maintain or progress achieved LOF    Status New    Target Date 01/26/21                   Patient will benefit from skilled therapeutic intervention in order to improve the following deficits and impairments:  Postural dysfunction,Pain,Decreased strength,Decreased range of motion  Visit Diagnosis: Cervical disc disorder  with radiculopathy - Plan: PT plan of care cert/re-cert  TOS (thoracic outlet syndrome) - Plan: PT plan of care cert/re-cert     Problem List Patient Active Problem List   Diagnosis Date Noted  . Anxiety disorder 04/01/2014  . Panic attacks 04/01/2014  . Suicidal thoughts 04/01/2014    Gar Ponto MS, PT 12/19/20 2:44 PM  [facility] [address] [place], [place], [address] Phone: [phone]   Fax:  [phone]  Name: Katrina Payne MRN: [id number] Date of Birth: 02/22/1991  PHYSICAL THERAPY DISCHARGE SUMMARY  Visits from Start of Care: 1 DOS: 12/06/2020  Current functional level related to goals / functional outcomes: As at evaluation   Remaining deficits: As at evaluation   Education / Equipment: Initial HEP - see evaluation  Plan: Patient agrees to discharge.  Patient goals were not met. Patient is being discharged due to the patient's request.  ?????        Pollyann Samples, [place] 12/19/2020 1442

## 2020-12-12 ENCOUNTER — Ambulatory Visit: Payer: 59

## 2020-12-19 ENCOUNTER — Ambulatory Visit: Payer: 59 | Admitting: Physical Therapy

## 2020-12-26 ENCOUNTER — Encounter: Payer: 59 | Admitting: Physical Therapy

## 2021-01-02 ENCOUNTER — Encounter: Payer: 59 | Admitting: Physical Therapy

## 2021-06-25 ENCOUNTER — Ambulatory Visit
Admission: EM | Admit: 2021-06-25 | Discharge: 2021-06-25 | Disposition: A | Discharge status: Home / Self Care | Payer: 59 | Attending: Emergency Medicine | Admitting: Emergency Medicine

## 2021-06-25 ENCOUNTER — Encounter: Payer: Self-pay | Admitting: Emergency Medicine

## 2021-06-25 DIAGNOSIS — H9202 Otalgia, left ear: Secondary | ICD-10-CM

## 2021-06-25 DIAGNOSIS — J029 Acute pharyngitis, unspecified: Secondary | ICD-10-CM | POA: Diagnosis not present

## 2021-06-25 LAB — POCT RAPID STREP A (OFFICE): Rapid Strep A Screen: NEGATIVE

## 2021-06-25 NOTE — Discharge Instructions (Addendum)
Your rapid strep test is negative.    Take Tylenol or ibuprofen as needed for discomfort.    Follow-up with your primary care provider if your symptoms are not improving.       

## 2021-06-25 NOTE — ED Triage Notes (Signed)
Pt c/o left ear pain and swollen tonsils sxs started yesterday.

## 2021-06-25 NOTE — ED Provider Notes (Signed)
[facility]    CSN: [id number] Arrival date & time: 06/25/21  1112      History   Chief Complaint Chief Complaint  Patient presents with   Otalgia   Swollen Tonsil    HPI Katrina Payne is a 30 y.o. female.  Patient presents with sore throat and left ear pain since yesterday.  No fever, rash, cough, shortness of breath, or other symptoms.  No treatments attempted.  She reports history of strep throat.  The history is provided by the patient.   History reviewed. No pertinent past medical history.  Patient Active Problem List   Diagnosis Date Noted   Anxiety disorder 04/01/2014   Panic attacks 04/01/2014   Suicidal thoughts 04/01/2014    History reviewed. No pertinent surgical history.  OB History   No obstetric history on file.      Home Medications    Prior to Admission medications   Medication Sig Start Date End Date Taking? Authorizing Provider  diclofenac (VOLTAREN) 75 MG EC tablet Take 1 tablet (75 mg total) by mouth 2 (two) times daily. 10/25/20   Mardella Layman, MD  norgestimate-ethinyl estradiol (ORTHO-CYCLEN,SPRINTEC,PREVIFEM) 0.25-35 MG-MCG tablet Take 1 tablet by mouth daily.    [provider]    Family History History reviewed. No pertinent family history.  Social History Social History   Tobacco Use   Smoking status: Every Day    Packs/day: 0.50    Types: Cigarettes   Smokeless tobacco: Never  Vaping Use   Vaping Use: Never used  Substance Use Topics   Alcohol use: Not Currently   Drug use: No     Allergies   Patient has no known allergies.   Review of Systems Review of Systems  Constitutional:  Negative for chills and fever.  HENT:  Positive for ear pain and sore throat.   Respiratory:  Negative for cough and shortness of breath.   Cardiovascular:  Negative for chest pain and palpitations.  Gastrointestinal:  Negative for diarrhea and vomiting.  Skin:  Negative for color change and rash.  All other systems  reviewed and are negative.   Physical Exam Triage Vital Signs ED Triage Vitals  Enc Vitals Group     BP 06/25/21 1245 117/73     Pulse Rate 06/25/21 1245 75     Resp 06/25/21 1245 16     Temp 06/25/21 1245 98.1 F (36.7 C)     Temp Source 06/25/21 1245 Oral     SpO2 06/25/21 1245 98 %     Weight --      Height --      Head Circumference --      Peak Flow --      Pain Score 06/25/21 1246 1     Pain Loc --      Pain Edu? --      Excl. in GC? --    No data found.  Updated Vital Signs BP 117/73 (BP Location: Left Arm)   Pulse 75   Temp 98.1 F (36.7 C) (Oral)   Resp 16   LMP 06/19/2021 (Approximate)   SpO2 98%   Visual Acuity Right Eye Distance:   Left Eye Distance:   Bilateral Distance:    Right Eye Near:   Left Eye Near:    Bilateral Near:     Physical Exam Vitals and nursing note reviewed.  Constitutional:      General: She is not in acute distress.    Appearance: She is well-developed.  She is not ill-appearing.  HENT:     Head: Normocephalic and atraumatic.     Right Ear: Tympanic membrane normal.     Left Ear: Tympanic membrane normal.     Nose: Nose normal.     Mouth/Throat:     Mouth: Mucous membranes are moist.     Pharynx: Oropharyngeal exudate and posterior oropharyngeal erythema present.  Cardiovascular:     Rate and Rhythm: Normal rate and regular rhythm.     Heart sounds: Normal heart sounds.  Pulmonary:     Effort: Pulmonary effort is normal. No respiratory distress.     Breath sounds: Normal breath sounds.  Musculoskeletal:     Cervical back: Neck supple.  Skin:    General: Skin is warm and dry.  Neurological:     Mental Status: She is alert.  Psychiatric:        Mood and Affect: Mood normal.        Behavior: Behavior normal.     UC Treatments / Results  Labs (all labs ordered are listed, but only abnormal results are displayed) Labs Reviewed  POCT RAPID STREP A (OFFICE)    EKG   Radiology No results  found.  Procedures Procedures (including critical care time)  Medications Ordered in UC Medications - No data to display  Initial Impression / Assessment and Plan / UC Course  I have reviewed the triage vital signs and the nursing notes.  Pertinent labs & imaging results that were available during my care of the patient were reviewed by me and considered in my medical decision making (see chart for details).  Sore throat, left ear pain.  Rapid strep negative.  Patient declines COVID or Flu test today.  Discussed symptomatic treatment including Tylenol or ibuprofen.  Instructed her to follow-up with your PCP if her symptoms are not improving.  She agrees to plan of care.   Final Clinical Impressions(s) / UC Diagnoses   Final diagnoses:  Sore throat  Left ear pain     Discharge Instructions      Your rapid strep test is negative.    Take Tylenol or ibuprofen as needed for discomfort.  Follow up with your primary care provider if your symptoms are not improving.          ED Prescriptions   None    PDMP not reviewed this encounter.   Mickie Bail, NP 06/25/21 1350

## 2021-12-25 LAB — HM PAP SMEAR: HM Pap smear: NORMAL

## 2021-12-25 LAB — RESULTS CONSOLE HPV: CHL HPV: NEGATIVE

## 2023-01-02 ENCOUNTER — Other Ambulatory Visit: Payer: Self-pay | Admitting: Family Medicine

## 2023-01-02 DIAGNOSIS — R29898 Other symptoms and signs involving the musculoskeletal system: Secondary | ICD-10-CM

## 2023-01-10 ENCOUNTER — Ambulatory Visit: Payer: 59

## 2023-03-23 ENCOUNTER — Inpatient Hospital Stay
Admission: RE | Admit: 2023-03-23 | Discharge: 2023-03-23 | Disposition: A | Discharge status: Home / Self Care | Payer: Self-pay | Source: Ambulatory Visit | Attending: Otolaryngology | Admitting: Otolaryngology

## 2023-03-23 ENCOUNTER — Other Ambulatory Visit: Payer: Self-pay | Admitting: Otolaryngology

## 2023-03-23 DIAGNOSIS — E041 Nontoxic single thyroid nodule: Secondary | ICD-10-CM

## 2023-04-01 ENCOUNTER — Other Ambulatory Visit: Payer: Self-pay | Admitting: Otolaryngology

## 2023-04-01 DIAGNOSIS — E041 Nontoxic single thyroid nodule: Secondary | ICD-10-CM

## 2023-04-02 NOTE — Progress Notes (Signed)
Simonne Come, MD sent to Katrina Payne S Please schedule for diagnostic thyroid US to be followed by potential US guided FNA of left thyroid nodule (if the nodule meets criteria on [place]).    These an be scheduled on the same day at [facility].  Nedra Hai

## 2023-04-03 NOTE — Progress Notes (Signed)
Patient for [place] Diagnostic Thyroid & US guided FNA LT Thyroid Nodule Biopsy on Monday 04/06/23, I called and spoke with the patient on the phone and gave pre-procedure instructions. Pt was made aware to be here at 1:45p and check in at the [facility]. Pt stated understanding.  Called 04/03/23

## 2023-04-06 ENCOUNTER — Ambulatory Visit
Admission: RE | Admit: 2023-04-06 | Discharge: 2023-04-06 | Disposition: A | Discharge status: Home / Self Care | Payer: 59 | Source: Ambulatory Visit | Attending: Otolaryngology | Admitting: Otolaryngology

## 2023-04-06 DIAGNOSIS — E041 Nontoxic single thyroid nodule: Secondary | ICD-10-CM | POA: Diagnosis present

## 2023-04-13 ENCOUNTER — Other Ambulatory Visit: Payer: Self-pay | Admitting: Otolaryngology

## 2023-04-13 DIAGNOSIS — E041 Nontoxic single thyroid nodule: Secondary | ICD-10-CM

## 2023-04-13 NOTE — Progress Notes (Signed)
Patient for US guided Mid LT & Inferior LT Thyroid Nodules Biopsy on Tues 04/14/2023, I called and spoke with the patient on the phone and gave pre-procedure instructions. Pt was made aware to be here at 2p and check in at the [facility]. Pt stated understanding.  Called 04/13/2023

## 2023-04-14 ENCOUNTER — Ambulatory Visit: Payer: 59 | Admitting: Primary Care

## 2023-04-14 ENCOUNTER — Ambulatory Visit
Admission: RE | Admit: 2023-04-14 | Discharge: 2023-04-14 | Disposition: A | Discharge status: Home / Self Care | Payer: 59 | Source: Ambulatory Visit | Attending: Otolaryngology | Admitting: Otolaryngology

## 2023-04-14 DIAGNOSIS — E041 Nontoxic single thyroid nodule: Secondary | ICD-10-CM

## 2023-04-14 DIAGNOSIS — E042 Nontoxic multinodular goiter: Secondary | ICD-10-CM | POA: Diagnosis not present

## 2023-04-14 MED ORDER — LIDOCAINE HCL (PF) 1 % IJ SOLN
10.0000 mL | Freq: Once | INTRAMUSCULAR | Status: AC
  Administered 2023-04-14: 10 mL via INTRADERMAL
  Filled 2023-04-14: qty 10

## 2023-04-14 NOTE — Discharge Instructions (Signed)
Given to patient by Verne Grain

## 2023-04-23 LAB — CYTOLOGY - NON PAP

## 2023-05-06 ENCOUNTER — Ambulatory Visit: Payer: 59 | Admitting: Diagnostic Neuroimaging

## 2023-08-13 ENCOUNTER — Encounter: Payer: Self-pay | Admitting: *Deleted

## 2023-08-17 ENCOUNTER — Ambulatory Visit: Payer: 59 | Admitting: Diagnostic Neuroimaging

## 2023-11-23 ENCOUNTER — Ambulatory Visit: Payer: 59 | Admitting: Diagnostic Neuroimaging

## 2023-11-23 ENCOUNTER — Encounter: Payer: Self-pay | Admitting: Diagnostic Neuroimaging

## 2023-11-23 VITALS — BP 127/77 | HR 85 | Ht 65.0 in | Wt 192.0 lb

## 2023-11-23 DIAGNOSIS — M898X1 Other specified disorders of bone, shoulder: Secondary | ICD-10-CM

## 2023-11-23 NOTE — Patient Instructions (Signed)
 LEFT SCAPULA PRESSURE / DISCOMFORT (since ~ 2022; intermittent numbness is improving) - refer to OT for shoulder stabilizing and strengthening exercise - neuro exam otherwise normal; no need for EMG/NCS at this time

## 2023-11-23 NOTE — Progress Notes (Signed)
 [facility]  PATIENT: Katrina Payne DOB: July 09, 1991  REFERRING CLINICIAN: Hamrick, Orest Bio, MD HISTORY FROM: patient  REASON FOR VISIT: new consult   HISTORICAL  CHIEF COMPLAINT:  Chief Complaint  Patient presents with   Extremity Weakness    Rm 7 alone  Pt is well, reports she has been having L hand weakness and numbness for about 4 yrs. She started having pain in her shoulder, facial numbness  as well as arm numbness. Has seen chiropractor, current has discomfort in shoulder and L hand weakness.     HISTORY OF PRESENT ILLNESS:   33 year old female here for evaluation of numbness.  3 4 years ago patient had some numbness and discomfort in her left scapular, shoulder blade region, radiating down her left hand and fingertips.  Some numbness also radiate up the left neck and into her face.  Symptoms were more continuous initially and then have become intermittent over time.  Patient was doing some exercise program at that time which seem to aggravate symptoms.  She is not done her strength training exercises today while in his focus and walking.   REVIEW OF SYSTEMS: Full 14 system review of systems performed and negative with exception of: as per HPI.  ALLERGIES: Allergies  Allergen Reactions   Crab Extract     HOME MEDICATIONS: Outpatient Medications Prior to Visit  Medication Sig Dispense Refill   omeprazole (PRILOSEC) 20 MG capsule Take 20 mg by mouth daily.     diclofenac  (VOLTAREN ) 75 MG EC tablet Take 1 tablet (75 mg total) by mouth 2 (two) times daily. 14 tablet 0   ferrous sulfate 325 (65 FE) MG EC tablet Take 325 mg by mouth daily with breakfast.     norgestimate-ethinyl estradiol (ORTHO-CYCLEN,SPRINTEC,PREVIFEM) 0.25-35 MG-MCG tablet Take 1 tablet by mouth daily.     No facility-administered medications prior to visit.    PAST MEDICAL HISTORY: Past Medical History:  Diagnosis Date   Heartburn    Prediabetes     PAST SURGICAL  HISTORY: History reviewed. No pertinent surgical history.  FAMILY HISTORY: Family History  Problem Relation Age of Onset   Hypertension Mother    Kidney Stones Father    Heart attack Maternal Grandfather    Diabetes Paternal Grandfather     SOCIAL HISTORY: Social History   Socioeconomic History   Marital status: Single    Spouse name: Not on file   Number of children: Not on file   Years of education: Not on file   Highest education level: Not on file  Occupational History   Not on file  Tobacco Use   Smoking status: Every Day    Current packs/day: 0.50    Types: Cigarettes   Smokeless tobacco: Never  Vaping Use   Vaping status: Never Used  Substance and Sexual Activity   Alcohol use: Not Currently   Drug use: No   Sexual activity: Not on file  Other Topics Concern   Not on file  Social History Narrative   Not on file   Social Drivers of Health   Financial Resource Strain: Not on file  Food Insecurity: Not on file  Transportation Needs: Not on file  Physical Activity: Not on file  Stress: Not on file  Social Connections: Unknown (12/10/2021)   Received from [facility], [facility]   Social Network    Social Network: Not on file  Intimate Partner Violence: Unknown (11/01/2021)   Received from [facility], [facility]   HITS  Physically Hurt: Not on file    Insult or Talk Down To: Not on file    Threaten Physical Harm: Not on file    Scream or Curse: Not on file     PHYSICAL EXAM  GENERAL EXAM/CONSTITUTIONAL: Vitals:  Vitals:   11/23/23 0825  BP: 127/77  Pulse: 85  Weight: 192 lb (87.1 kg)  Height: 5\' 5"  (1.651 m)   Body mass index is 31.95 kg/m. Wt Readings from Last 3 Encounters:  11/23/23 192 lb (87.1 kg)  11/12/20 158 lb (71.7 kg)  04/27/13 141 lb (64 kg)   Patient is in no distress; well developed, nourished and groomed; neck is supple  CARDIOVASCULAR: Examination of carotid arteries is normal; no carotid bruits Regular rate  and rhythm, no murmurs Examination of peripheral vascular system by observation and palpation is normal  EYES: Ophthalmoscopic exam of optic discs and posterior segments is normal; no papilledema or hemorrhages No results found.  MUSCULOSKELETAL: Gait, strength, tone, movements noted in Neurologic exam below  NEUROLOGIC: MENTAL STATUS:      No data to display         awake, alert, oriented to person, place and time recent and remote memory intact normal attention and concentration language fluent, comprehension intact, naming intact fund of knowledge appropriate  CRANIAL NERVE:  2nd - no papilledema on fundoscopic exam 2nd, 3rd, 4th, 6th - pupils equal and reactive to light, visual fields full to confrontation, extraocular muscles intact, no nystagmus 5th - facial sensation symmetric 7th - facial strength symmetric 8th - hearing intact 9th - palate elevates symmetrically, uvula midline 11th - shoulder shrug symmetric 12th - tongue protrusion midline  MOTOR:  normal bulk and tone, full strength in the BUE, BLE  SENSORY:  normal and symmetric to light touch, temperature, vibration  COORDINATION:  finger-nose-finger, fine finger movements normal  REFLEXES:  deep tendon reflexes present and symmetric  GAIT/STATION:  narrow based gait     DIAGNOSTIC DATA (LABS, IMAGING, TESTING) - I reviewed patient records, labs, notes, testing and imaging myself where available.  Lab Results  Component Value Date   WBC 5.4 03/31/2014   HGB 13.1 03/31/2014   HCT 40.8 03/31/2014   MCV 82.3 03/31/2014   PLT 318 03/31/2014      Component Value Date/Time   NA 140 03/31/2014 1927   K 3.9 03/31/2014 1927   CL 102 03/31/2014 1927   CO2 25 03/31/2014 1927   GLUCOSE 101 (H) 03/31/2014 1927   BUN 9 03/31/2014 1927   CREATININE 0.68 03/31/2014 1927   CALCIUM 9.5 03/31/2014 1927   PROT 6.3 10/16/2008 0643   ALBUMIN 3.6 10/16/2008 0643   AST 14 10/16/2008 0643   ALT 11  10/16/2008 0643   ALKPHOS 60 10/16/2008 0643   BILITOT 1.0 10/16/2008 0643   GFRNONAA >90 03/31/2014 1927   GFRAA >90 03/31/2014 1927   No results found for: "CHOL", "HDL", "LDLCALC", "LDLDIRECT", "TRIG", "CHOLHDL" No results found for: "HGBA1C" No results found for: "VITAMINB12" Lab Results  Component Value Date   TSH 0.587 Test methodology is 3rd generation TSH 10/16/2008    01/30/23 MRI cervical spine - Unremarkable MRI of the cervical spine. No spinal or foraminal stenosis.  2 left thyroid  nodules measuring up to 1.9 cm. Recommend thyroid  ultrasound for further evaluation.    ASSESSMENT AND PLAN  33 y.o. year old female here with:   Dx:  1. Pain of left scapula     PLAN:  LEFT SCAPULA PRESSURE / DISCOMFORT (  since ~ 2022; intermittent numbness is improving; likely musculoskeletal strain / scapular dysfunction) - refer to OT for shoulder stabilizing and strengthening exercise - neuro exam otherwise normal; no need for EMG/NCS at this time  Orders Placed This Encounter  Procedures   Ambulatory referral to Occupational Therapy   Return for pending if symptoms worsen or fail to improve, return to PCP.    Omega Bible, MD 11/23/2023, 9:30 AM Certified in Neurology, Neurophysiology and Neuroimaging  [facility] [address], [address] [place], [place] [address] [phone]

## 2023-11-30 NOTE — Therapy (Signed)
 OUTPATIENT OCCUPATIONAL THERAPY ORTHO EVALUATION  Patient Name: Katrina Payne MRN: [id number] DOB:02/19/1991, 33 y.o., female Today's Date: 12/01/2023  PCP: Sherlean Dirk MD REFERRING PROVIDER:  Omega Bible, MD    END OF SESSION:  OT End of Session - 12/01/23 0803     Visit Number 1    Number of Visits 10    Date for OT Re-Evaluation 01/15/24    Authorization Type BC[employer]    OT Start Time 0804    OT Stop Time 0849    OT Time Calculation (min) 45 min    Activity Tolerance Patient tolerated treatment well;No increased pain;Patient limited by pain    Behavior During Therapy [facility] for tasks assessed/performed             Past Medical History:  Diagnosis Date   Heartburn    Prediabetes    History reviewed. No pertinent surgical history. Patient Active Problem List   Diagnosis Date Noted   Anxiety disorder 04/01/2014   Panic attacks 04/01/2014   Suicidal thoughts 04/01/2014    ONSET DATE: chronic onset ~4 years  REFERRING DIAG: M89.8X1 (ICD-10-CM) - Pain of left scapula   THERAPY DIAG:  Pain in left hand  Paresthesia of skin  Chronic left shoulder pain  Stiffness of left shoulder, not elsewhere classified  Muscle weakness (generalized)  Rationale for Evaluation and Treatment: Rehabilitation  SUBJECTIVE:   SUBJECTIVE STATEMENT: She states had seen chiropractor and developed some neck pain, though numbness did improve, she states having spasms in the shoulder blade that when they get "worked" she gets relieved of her pain and numbness. She states her Lt hand feels like wearing "a glove" at times.  She states her balance is off at times.  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  She has stopped working out and such out of fear. She also wakes up sore "every morning."   PERTINENT HISTORY: "Left scapula pressure, discomfort; eval and treat for shoulder strengthening and stabilizing exercises "   "reports she has been having L hand weakness and numbness for  about 4 yrs. She started having pain in her shoulder, facial numbness as well as arm numbness. Has seen chiropractor, current has discomfort in shoulder and L hand weakness "  PRECAUTIONS: None  RED FLAGS: None   WEIGHT BEARING RESTRICTIONS: No  PAIN:  Are you having pain? Yes: NPRS scale: 0/10 at rest now, up to 3-4/10 at worst in past few weeks Pain location: Infraspinatus, medial border of scapula on the left side, numbness down the anterior lateral forearm and shoulder Pain description: Mild with light paresthesia at times Aggravating factors: Work postures, certain sleeping postures Relieving factors: ?  FALLS: Has patient fallen in last 6 months? No  PLOF: Independent  PATIENT GOALS: To improve muscle spasm in her back as well as pain and paresthesia that also travels down her left arm  NEXT MD VISIT: As needed   OBJECTIVE: (All objective assessments below are from initial evaluation on: 12/01/23 unless otherwise specified.)   HAND DOMINANCE: Right   ADLs: Overall ADLs: States decreased ability to grab, hold household objects, lift arms above head or reach into tall cabinets, perform self-care/maintenance routines with weight lifting, etc.    FUNCTIONAL OUTCOME MEASURES: Eval: Patient Specific Functional Scale: 3 (pushups, lifting above head, sitting while working)  (Higher Score  =  Better Ability for the Selected Tasks)      UPPER EXTREMITY ROM     Shoulder to Wrist AROM Right eval Left  eval  Shoulder flexion 158 147  Shoulder abduction  159  Shoulder extension  68  Shoulder internal rotation  41  Shoulder external rotation  73  Elbow flexion    Elbow extension    Forearm supination    Forearm pronation     Wrist flexion  73  Wrist extension  85  Wrist ulnar deviation    Wrist radial deviation    Functional dart thrower's motion (F-DTM) in ulnar flexion    F-DTM in radial extension     (Blank rows = not tested)   Hand AROM Left eval  Full Fist  Ability (or Gap to Distal Palmar Crease) full  Thumb Opposition  (Kapandji Scale)  full  (Blank rows = not tested)   UPPER EXTREMITY MMT:       MMT Left 12/01/23  Shoulder flexion   Shoulder abduction   Shoulder adduction   Shoulder extension   Shoulder internal rotation   Shoulder external rotation   Middle trapezius   Lower trapezius   Elbow flexion 5/5  Elbow extension 4/5  Forearm supination   Forearm pronation   Wrist flexion 5/5  Wrist extension 5/5  Wrist ulnar deviation   Wrist radial deviation   (Blank rows = not tested)  HAND FUNCTION: Eval: No observed weakness in affected Lt hand.  Grip strength Right: 79 lbs, Left: 88 lbs   COORDINATION: Eval: No significant coordination issues noted in the left hand now  SENSATION: Eval:  Light touch intact today, no paresthesia at rest  EDEMA:   Eval: None  COGNITION: Eval: Overall cognitive status: WFL for evaluation today   OBSERVATIONS:   Eval: Muscle spasms noted in infraspinatus on the left side as well as the teres group, as well as tender to the medial border of the scapula.  She presents as likely pinched axillary nerve in the quadrilateral space in the posterior shoulder and arm, and some traveling paresthesia to this nerve distribution and also weakness of the tricep.  Some radial nerve involvement as well obviously   TODAY'S TREATMENT:  Post-evaluation treatment:   For safety/self-care she was given information on preventing nerve paresthesia including avoiding sleeping with her hands above her head or compressing the nerve at the [facility] joint with a tight ear to shoulder posture.  These things are listed under patient instructions.  She was also told to monitor postures while working, and was given the following home exercise program to perform 2-3 times a day consistently to open up this posterior shoulder area and prevent nerve entrapment.  These were shown to her quickly today, and she states feeling a bit  better even during the performance today.    Exercises - Seated Scapular Retraction  - 2-3 x daily - 5 reps - 10 sec hold - Sleeper Stretch  - 2-3 x daily - 5 reps - 15-20 sec hold - Standing neck/upper traps stretch  - 2-3 x daily - 3-5 reps - 15 sec hold - Standing Shoulder Abduction Slides at Wall  - 2-3 x daily - 1 sets - 3-5 reps - 15 sec hold - Tricep Stretch- DO SEATED BY TABLE  - 3-4 x daily - 3-5 reps - 15 hold   PATIENT EDUCATION: Education details: See tx section above for details  Person educated: Patient Education method: Verbal Instruction, Teach back, Handouts  Education comprehension: States and demonstrates understanding   HOME EXERCISE PROGRAM: Access Code: [email or web address] URL: [email or web address] Date: 12/01/2023 Prepared by: Leartis Proud   GOALS: Goals  reviewed with patient? Yes   SHORT TERM GOALS: (STG required if POC>30 days) Target Date: 12/18/2023  Pt will demo/state understanding of initial HEP to improve pain levels and prerequisite motion. Goal status: INITIAL   LONG TERM GOALS: Target Date: 01/15/24  Pt will improve functional ability by decreased impairment per PSFS  assessment from 3 to 6 or better, for better quality of life. Goal status: INITIAL  2.  Pt will improve A/ROM in left shoulder internal rotation from 41 degrees to at least 55 degrees, to have functional motion for tasks like reach and grasp.  Goal status: INITIAL  3.  Pt will improve strength in left tricep from 4/5 MMT to at least 4+/5 MMT to have increased functional ability to carry out selfcare and higher-level homecare tasks with less difficulty. Goal status: INITIAL  4.  Pt will decrease pain at worst from 3-4/10 to 1/10 or better to have better sleep and occupational participation in daily roles. Goal status: INITIAL   ASSESSMENT:  CLINICAL IMPRESSION: Patient is a 33 y.o. female who was seen today for occupational therapy evaluation for mainly left  scapular/rear shoulder issues that present as the quadrilateral space issue and likely axillary nerve impingement.  She will benefit from outpatient occupational therapy to decrease the symptoms and increase quality of life.Aaron Aas   PERFORMANCE DEFICITS: in functional skills including ADLs, IADLs, ROM, strength, pain, fascial restrictions, muscle spasms, body mechanics, endurance, and UE functional use, cognitive skills including problem solving, and psychosocial skills including coping strategies and habits.   IMPAIRMENTS: are limiting patient from ADLs, IADLs, and work.   COMORBIDITIES: may have co-morbidities  that affects occupational performance. Patient will benefit from skilled OT to address above impairments and improve overall function.  MODIFICATION OR ASSISTANCE TO COMPLETE EVALUATION: No modification of tasks or assist necessary to complete an evaluation.  OT OCCUPATIONAL PROFILE AND HISTORY: Detailed assessment: Review of records and additional review of physical, cognitive, psychosocial history related to current functional performance.  CLINICAL DECISION MAKING: LOW - limited treatment options, no task modification necessary  REHAB POTENTIAL: Excellent  EVALUATION COMPLEXITY: Low      PLAN:  OT FREQUENCY: 1-2x/week  OT DURATION: 6 weeks through 01/15/2024 and up to 10 total visits as needed  PLANNED INTERVENTIONS: 97168 OT Re-evaluation, 97535 self care/ADL training, 29562 therapeutic exercise, 97530 therapeutic activity, 97112 neuromuscular re-education, 97140 manual therapy, 97035 ultrasound, 97010 moist heat, 97010 cryotherapy, 97760 Orthotic Initial, 97763 Orthotic/Prosthetic subsequent, Dry needling, coping strategies training, and patient/family education  RECOMMENDED OTHER SERVICES: none now   CONSULTED AND AGREED WITH PLAN OF CARE: Patient  PLAN FOR NEXT SESSION:   Review initial recommendations and home exercise program, add nerve gliding, progress as tolerated  into strengthening   Avalee Castrellon, OTR/L, CHT 12/01/2023, 10:11 AM

## 2023-12-01 ENCOUNTER — Ambulatory Visit: Admitting: Rehabilitative and Restorative Service Providers"

## 2023-12-01 ENCOUNTER — Encounter: Payer: Self-pay | Admitting: Rehabilitative and Restorative Service Providers"

## 2023-12-01 DIAGNOSIS — G8929 Other chronic pain: Secondary | ICD-10-CM

## 2023-12-01 DIAGNOSIS — M25612 Stiffness of left shoulder, not elsewhere classified: Secondary | ICD-10-CM

## 2023-12-01 DIAGNOSIS — M79642 Pain in left hand: Secondary | ICD-10-CM | POA: Diagnosis not present

## 2023-12-01 DIAGNOSIS — R202 Paresthesia of skin: Secondary | ICD-10-CM | POA: Diagnosis not present

## 2023-12-01 DIAGNOSIS — M6281 Muscle weakness (generalized): Secondary | ICD-10-CM

## 2023-12-01 DIAGNOSIS — M25512 Pain in left shoulder: Secondary | ICD-10-CM | POA: Diagnosis not present

## 2023-12-01 NOTE — Patient Instructions (Signed)
 Management of hand/arm numbness or "paresthesia"  Peripheral nerves do heal and regenerate- IF you are good to them and change habits/routines.   How you will be successful:  Change habits/routines, prevent "kinking the hose" and any numbness Exercise program to "unstick" nerves, loosen tightness in body Protect with supportive bracing as helpful  Avoid:  Repetitive or BAD prolonged postures Avoid "kinks" at neck, shoulder, elbow, forearm and wrist!! Generally, keep hands DOWN at night. Arm above head = bad Try to keep elbows at 90* or straighter  Don't bend or extend wrists extremely  Have pillow support for head/neck in neutral position  Watch resting with palm down and wrist flexed ALL THE TIME Things that hurt or cause numbness Direct pressure on "nerve points" For example: sleeping on hands, pressure while typing, resting wrist on steering wheel, resting elbow on center console, etc.   Specifics:  Change your sleep habits! Start every day with a non-painful stretch routine and gentle nerve "gliding" assigned by your therapist.  Stretch again, twice that day.   It's smart to gently warmup your body and stretch before doing typically stressful activities (or before bed if your hands hurt/go numb in the night).  If you must do repetitive or stressful activities, or can't break your sleep habits, try some type of support (wrist braces in night, etc.). Using an extra pillow and putting your hand in the pillowcase could help, too. Don't hold your phone to long! Using handles with padding, padded gloves can help with compression or vibration. Take breaks for rest and recovery; don't "force" yourself to finish a job.   Keep hands covered and warm in the winter or cool, rainy weather.

## 2023-12-07 NOTE — Therapy (Signed)
 OUTPATIENT OCCUPATIONAL THERAPY TREATMENT NOTE   Patient Name: Katrina Payne MRN: [id number] DOB:06-Mar-1991, 33 y.o., female Today's Date: 12/08/2023  PCP: Sherlean Dirk MD REFERRING PROVIDER:  Omega Bible, MD    END OF SESSION:  OT End of Session - 12/08/23 0804     Visit Number 2    Number of Visits 10    Date for OT Re-Evaluation 01/15/24    Authorization Type [employer]    OT Start Time 0804    OT Stop Time 0842    OT Time Calculation (min) 38 min    Activity Tolerance Patient tolerated treatment well;No increased pain;Patient limited by pain    Behavior During Therapy [facility] for tasks assessed/performed              Past Medical History:  Diagnosis Date   Heartburn    Prediabetes    History reviewed. No pertinent surgical history. Patient Active Problem List   Diagnosis Date Noted   Anxiety disorder 04/01/2014   Panic attacks 04/01/2014   Suicidal thoughts 04/01/2014    ONSET DATE: chronic onset ~4 years  REFERRING DIAG: M89.8X1 (ICD-10-CM) - Pain of left scapula   THERAPY DIAG:  Pain in left hand  Paresthesia of skin  Stiffness of left shoulder, not elsewhere classified  Muscle weakness (generalized)  Chronic left shoulder pain  Rationale for Evaluation and Treatment: Rehabilitation  PERTINENT HISTORY: "Left scapula pressure, discomfort; eval and treat for shoulder strengthening and stabilizing exercises "   "reports she has been having L hand weakness and numbness for about 4 yrs. She started having pain in her shoulder, facial numbness as well as arm numbness. Has seen chiropractor, current has discomfort in shoulder and L hand weakness " She states had seen chiropractor and developed some neck pain, though numbness did improve, she states having spasms in the shoulder blade that when they get "worked" she gets relieved of her pain and numbness. She states her Lt hand feels like wearing "a glove" at times.  She states her balance is off at times.   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  She has stopped working out and such out of fear. She also wakes up sore "every morning."  PRECAUTIONS: None  RED FLAGS: None   WEIGHT BEARING RESTRICTIONS: No   SUBJECTIVE:   SUBJECTIVE STATEMENT: She states less soreness waking up in the mornings, less symptomatic.    PAIN:  Are you having pain? Yes: NPRS scale:  0/10 at rest now, up to 3-4/10 at worst in past few weeks Pain location: Infraspinatus, medial border of scapula on the left side, numbness down the anterior lateral forearm and shoulder Pain description: Mild with light paresthesia at times Aggravating factors: Work postures, certain sleeping postures Relieving factors: ?   PATIENT GOALS: To improve muscle spasm in her back as well as pain and paresthesia that also travels down her left arm  NEXT MD VISIT: As needed   OBJECTIVE: (All objective assessments below are from initial evaluation on: 12/01/23 unless otherwise specified.)   HAND DOMINANCE: Right   ADLs: Overall ADLs: States decreased ability to grab, hold household objects, lift arms above head or reach into tall cabinets, perform self-care/maintenance routines with weight lifting, etc.    FUNCTIONAL OUTCOME MEASURES: Eval: Patient Specific Functional Scale: 3 (pushups, lifting above head, sitting while working)  (Higher Score  =  Better Ability for the Selected Tasks)      UPPER EXTREMITY ROM     Shoulder to Wrist  AROM Right eval Left eval Lt TBD  Shoulder flexion 158 147   Shoulder abduction  159   Shoulder extension  68   Shoulder internal rotation  41   Shoulder external rotation  73   Elbow flexion     Elbow extension     Forearm supination     Forearm pronation      Wrist flexion  73   Wrist extension  85   Wrist ulnar deviation     Wrist radial deviation     Functional dart thrower's motion (F-DTM) in ulnar flexion     F-DTM in radial extension      (Blank rows = not tested)   Hand  AROM Left eval  Full Fist Ability (or Gap to Distal Palmar Crease) full  Thumb Opposition  (Kapandji Scale)  full  (Blank rows = not tested)   UPPER EXTREMITY MMT:       MMT Left 12/01/23  Shoulder flexion   Shoulder abduction   Shoulder adduction   Shoulder extension   Shoulder internal rotation   Shoulder external rotation   Middle trapezius   Lower trapezius   Elbow flexion 5/5  Elbow extension 4/5  Forearm supination   Forearm pronation   Wrist flexion 5/5  Wrist extension 5/5  Wrist ulnar deviation   Wrist radial deviation   (Blank rows = not tested)  HAND FUNCTION: Eval: No observed weakness in affected Lt hand.  Grip strength Right: 79 lbs, Left: 88 lbs   COORDINATION: Eval: No significant coordination issues noted in the left hand now  SENSATION: Eval:  Light touch intact today, no paresthesia at rest  OBSERVATIONS:   Eval: Muscle spasms noted in infraspinatus on the left side as well as the teres group, as well as tender to the medial border of the scapula.  She presents as likely pinched axillary nerve in the quadrilateral space in the posterior shoulder and arm, and some traveling paresthesia to this nerve distribution and also weakness of the tricep.  Some radial nerve involvement as well obviously   TODAY'S TREATMENT:  12/07/23: Today we reviewed her home exercise program and also self-care including monitoring sleep and work postures.  She performs each of the exercises below including new nerve gliding for the radial nerve as well as new strengthening for triceps and shoulder muscles in hopes that we can perform a "contract/relax" technique to loosen the quadrangular space.  She states strengthening today only made her very slightly symptomatic and then it immediately resolved.  She was told to return to a stretch only program if new strengthening causes her to be symptomatic significantly.  She states understanding all directions and leaves feeling better.      Exercises - Seated Scapular Retraction  - 2-3 x daily - 5 reps - 10 sec hold - Sleeper Stretch  - 2-3 x daily - 5 reps - 15-20 sec hold - Standing neck/upper traps stretch  - 2-3 x daily - 3-5 reps - 15 sec hold - Standing Shoulder Abduction Slides at Wall  - 2-3 x daily - 1 sets - 3-5 reps - 15 sec hold - Tricep Stretch- DO SEATED BY TABLE  - 3-4 x daily - 3-5 reps - 15 hold - Standing Radial Nerve Glide  - 3 x daily - 5-10 reps - 2 sec hold - CLX Tricep Pressdowns  - 2-3 x daily - 4-5 x weekly - 1-2 sets - 10-15 reps - 3 sec hold - Scapular Retraction with  Resistance Advanced  - 2-3 x daily - 4-5 x weekly - 1-2 sets - 10-15 reps - 3 sec hold  PATIENT EDUCATION: Education details: See tx section above for details  Person educated: Patient Education method: Verbal Instruction, Teach back, Handouts  Education comprehension: States and demonstrates understanding   HOME EXERCISE PROGRAM: Access Code: [email or web address] URL: [email or web address] Date: 12/01/2023 Prepared by: Leartis Proud   GOALS: Goals reviewed with patient? Yes   SHORT TERM GOALS: (STG required if POC>30 days) Target Date: 12/18/2023  Pt will demo/state understanding of initial HEP to improve pain levels and prerequisite motion. Goal status: INITIAL   LONG TERM GOALS: Target Date: 01/15/24  Pt will improve functional ability by decreased impairment per PSFS  assessment from 3 to 6 or better, for better quality of life. Goal status: INITIAL  2.  Pt will improve A/ROM in left shoulder internal rotation from 41 degrees to at least 55 degrees, to have functional motion for tasks like reach and grasp.  Goal status: INITIAL  3.  Pt will improve strength in left tricep from 4/5 MMT to at least 4+/5 MMT to have increased functional ability to carry out selfcare and higher-level homecare tasks with less difficulty. Goal status: INITIAL  4.  Pt will decrease pain at worst from 3-4/10 to 1/10 or better to  have better sleep and occupational participation in daily roles. Goal status: INITIAL   ASSESSMENT:  CLINICAL IMPRESSION: 12/08/23: Tolerating strengthening portion well and nerve gliding was highly effective today.  Carry on  Eval: Patient is a 33 y.o. 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  She will benefit from outpatient occupational therapy to decrease the symptoms and increase quality of life.Aaron Aas     PLAN:  OT FREQUENCY: 1-2x/week  OT DURATION: 6 weeks through 01/15/2024 and up to 10 total visits as needed  PLANNED INTERVENTIONS: 97168 OT Re-evaluation, 97535 self care/ADL training, 81191 therapeutic exercise, 97530 therapeutic activity, 97112 neuromuscular re-education, 97140 manual therapy, 97035 ultrasound, 97010 moist heat, 97010 cryotherapy, 97760 Orthotic Initial, 97763 Orthotic/Prosthetic subsequent, Dry needling, coping strategies training, and patient/family education  RECOMMENDED OTHER SERVICES: none now   CONSULTED AND AGREED WITH PLAN OF CARE: Patient  PLAN FOR NEXT SESSION:   Check new strength, consider ER strength,  consider prog note if patient is confident and symptoms are largely resolved  Leartis Proud, OTR/L, CHT 12/08/2023, 8:46 AM

## 2023-12-08 ENCOUNTER — Ambulatory Visit: Admitting: Rehabilitative and Restorative Service Providers"

## 2023-12-08 ENCOUNTER — Encounter: Payer: Self-pay | Admitting: Rehabilitative and Restorative Service Providers"

## 2023-12-08 DIAGNOSIS — R202 Paresthesia of skin: Secondary | ICD-10-CM

## 2023-12-08 DIAGNOSIS — M25512 Pain in left shoulder: Secondary | ICD-10-CM

## 2023-12-08 DIAGNOSIS — M6281 Muscle weakness (generalized): Secondary | ICD-10-CM

## 2023-12-08 DIAGNOSIS — M25612 Stiffness of left shoulder, not elsewhere classified: Secondary | ICD-10-CM

## 2023-12-08 DIAGNOSIS — M79642 Pain in left hand: Secondary | ICD-10-CM | POA: Diagnosis not present

## 2023-12-08 DIAGNOSIS — G8929 Other chronic pain: Secondary | ICD-10-CM

## 2023-12-14 NOTE — Therapy (Incomplete)
 OUTPATIENT OCCUPATIONAL THERAPY TREATMENT NOTE   Patient Name: Katrina Payne MRN: [id number] DOB:1991-06-09, 33 y.o., female Today's Date: 12/14/2023  PCP: Sherlean Dirk MD REFERRING PROVIDER:  Omega Bible, MD    END OF SESSION:     Past Medical History:  Diagnosis Date   Heartburn    Prediabetes    No past surgical history on file. Patient Active Problem List   Diagnosis Date Noted   Anxiety disorder 04/01/2014   Panic attacks 04/01/2014   Suicidal thoughts 04/01/2014    ONSET DATE: chronic onset ~4 years  REFERRING DIAG: M89.8X1 (ICD-10-CM) - Pain of left scapula   THERAPY DIAG:  No diagnosis found.  Rationale for Evaluation and Treatment: Rehabilitation  PERTINENT HISTORY: "Left scapula pressure, discomfort; eval and treat for shoulder strengthening and stabilizing exercises "   "reports she has been having L hand weakness and numbness for about 4 yrs. She started having pain in her shoulder, facial numbness as well as arm numbness. Has seen chiropractor, current has discomfort in shoulder and L hand weakness " She states had seen chiropractor and developed some neck pain, though numbness did improve, she states having spasms in the shoulder blade that when they get "worked" she gets relieved of her pain and numbness. She states her Lt hand feels like wearing "a glove" at times.  She states her balance is off at times.  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  She has stopped working out and such out of fear. She also wakes up sore "every morning."  PRECAUTIONS: None  RED FLAGS: None   WEIGHT BEARING RESTRICTIONS: No   SUBJECTIVE:   SUBJECTIVE STATEMENT: She states ***   less soreness waking up in the mornings, less symptomatic.    PAIN:  Are you having pain? Yes: NPRS scale:  *** 0/10 at rest now, up to 3-4/10 at worst in past few weeks Pain location: Infraspinatus, medial border of scapula on the left side, numbness down the anterior lateral  forearm and shoulder Pain description: Mild with light paresthesia at times Aggravating factors: Work postures, certain sleeping postures Relieving factors: ?   PATIENT GOALS: To improve muscle spasm in her back as well as pain and paresthesia that also travels down her left arm  NEXT MD VISIT: As needed   OBJECTIVE: (All objective assessments below are from initial evaluation on: 12/01/23 unless otherwise specified.)   HAND DOMINANCE: Right   ADLs: Overall ADLs: States decreased ability to grab, hold household objects, lift arms above head or reach into tall cabinets, perform self-care/maintenance routines with weight lifting, etc.    FUNCTIONAL OUTCOME MEASURES: Eval: Patient Specific Functional Scale: 3 (pushups, lifting above head, sitting while working)  (Higher Score  =  Better Ability for the Selected Tasks)      UPPER EXTREMITY ROM     Shoulder to Wrist AROM Right eval Left eval Lt 12/15/23  Shoulder flexion 158 147 ***  Shoulder abduction  159   Shoulder extension  68   Shoulder internal rotation  41 ***  Shoulder external rotation  73   Elbow flexion     Elbow extension     Forearm supination     Forearm pronation      Wrist flexion  73   Wrist extension  85   Wrist ulnar deviation     Wrist radial deviation     Functional dart thrower's motion (F-DTM) in ulnar flexion     F-DTM in radial extension      (  Blank rows = not tested)   Hand AROM Left eval  Full Fist Ability (or Gap to Distal Palmar Crease) full  Thumb Opposition  (Kapandji Scale)  full  (Blank rows = not tested)   UPPER EXTREMITY MMT:       MMT Left 12/01/23 Lt 12/15/23  Shoulder flexion    Shoulder abduction    Shoulder adduction    Shoulder extension    Shoulder internal rotation    Shoulder external rotation    Middle trapezius    Lower trapezius    Elbow flexion 5/5   Elbow extension 4/5 ***/5  Forearm supination    Forearm pronation    Wrist flexion 5/5   Wrist extension  5/5   Wrist ulnar deviation    Wrist radial deviation    (Blank rows = not tested)  HAND FUNCTION: Eval: No observed weakness in affected Lt hand.  Grip strength Right: 79 lbs, Left: 88 lbs   SENSATION: Eval:  Light touch intact today, no paresthesia at rest  OBSERVATIONS:   Eval: Muscle spasms noted in infraspinatus on the left side as well as the teres group, as well as tender to the medial border of the scapula.  She presents as likely pinched axillary nerve in the quadrilateral space in the posterior shoulder and arm, and some traveling paresthesia to this nerve distribution and also weakness of the tricep.  Some radial nerve involvement as well obviously   TODAY'S TREATMENT:  12/15/23: *** Check new strength, consider ER strength,  consider prog note if patient is confident and symptoms are largely resolved  12/07/23: Today we reviewed her home exercise program and also self-care including monitoring sleep and work postures.  She performs each of the exercises below including new nerve gliding for the radial nerve as well as new strengthening for triceps and shoulder muscles in hopes that we can perform a "contract/relax" technique to loosen the quadrangular space.  She states strengthening today only made her very slightly symptomatic and then it immediately resolved.  She was told to return to a stretch only program if new strengthening causes her to be symptomatic significantly.  She states understanding all directions and leaves feeling better.     Exercises - Seated Scapular Retraction  - 2-3 x daily - 5 reps - 10 sec hold - Sleeper Stretch  - 2-3 x daily - 5 reps - 15-20 sec hold - Standing neck/upper traps stretch  - 2-3 x daily - 3-5 reps - 15 sec hold - Standing Shoulder Abduction Slides at Wall  - 2-3 x daily - 1 sets - 3-5 reps - 15 sec hold - Tricep Stretch- DO SEATED BY TABLE  - 3-4 x daily - 3-5 reps - 15 hold - Standing Radial Nerve Glide  - 3 x daily - 5-10 reps - 2 sec  hold - CLX Tricep Pressdowns  - 2-3 x daily - 4-5 x weekly - 1-2 sets - 10-15 reps - 3 sec hold - Scapular Retraction with Resistance Advanced  - 2-3 x daily - 4-5 x weekly - 1-2 sets - 10-15 reps - 3 sec hold  PATIENT EDUCATION: Education details: See tx section above for details  Person educated: Patient Education method: Verbal Instruction, Teach back, Handouts  Education comprehension: States and demonstrates understanding   HOME EXERCISE PROGRAM: Access Code: [email or web address] URL: [email or web address] Date: 12/01/2023 Prepared by: Leartis Proud   GOALS: Goals reviewed with patient? Yes   SHORT TERM GOALS: (STG required if POC>30 days) Target  Date: 12/18/2023  Pt will demo/state understanding of initial HEP to improve pain levels and prerequisite motion. Goal status: INITIAL   LONG TERM GOALS: Target Date: 01/15/24  Pt will improve functional ability by decreased impairment per PSFS  assessment from 3 to 6 or better, for better quality of life. Goal status: INITIAL  2.  Pt will improve A/ROM in left shoulder internal rotation from 41 degrees to at least 55 degrees, to have functional motion for tasks like reach and grasp.  Goal status: INITIAL  3.  Pt will improve strength in left tricep from 4/5 MMT to at least 4+/5 MMT to have increased functional ability to carry out selfcare and higher-level homecare tasks with less difficulty. Goal status: INITIAL  4.  Pt will decrease pain at worst from 3-4/10 to 1/10 or better to have better sleep and occupational participation in daily roles. Goal status: INITIAL   ASSESSMENT:  CLINICAL IMPRESSION: 12/15/23: ***  12/08/23: Tolerating strengthening portion well and nerve gliding was highly effective today.  Carry on  Eval: Patient is a 33 y.o. female who was seen today for occupational therapy evaluation for mainly left scapular/rear shoulder issues that present as the quadrilateral space issue and likely axillary  nerve impingement.  She will benefit from outpatient occupational therapy to decrease the symptoms and increase quality of life.Aaron Aas     PLAN:  OT FREQUENCY: 1-2x/week  OT DURATION: 6 weeks through 01/15/2024 and up to 10 total visits as needed  PLANNED INTERVENTIONS: 97168 OT Re-evaluation, 97535 self care/ADL training, 16109 therapeutic exercise, 97530 therapeutic activity, 97112 neuromuscular re-education, 97140 manual therapy, 97035 ultrasound, 97010 moist heat, 97010 cryotherapy, 97760 Orthotic Initial, 97763 Orthotic/Prosthetic subsequent, Dry needling, coping strategies training, and patient/family education  RECOMMENDED OTHER SERVICES: none now   CONSULTED AND AGREED WITH PLAN OF CARE: Patient  PLAN FOR NEXT SESSION:   ***   Kassidie Hendriks, OTR/L, CHT 12/14/2023, 6:13 PM

## 2023-12-15 ENCOUNTER — Encounter: Admitting: Rehabilitative and Restorative Service Providers"

## 2023-12-15 NOTE — Therapy (Signed)
 OUTPATIENT OCCUPATIONAL THERAPY TREATMENT NOTE   Patient Name: Katrina Payne MRN: [id number] DOB:Sep 30, 1990, 33 y.o., female Today's Date: 12/22/2023  PCP: Sherlean Dirk MD REFERRING PROVIDER:  Omega Bible, MD    END OF SESSION:  OT End of Session - 12/22/23 0803     Visit Number 3    Number of Visits 10    Date for OT Re-Evaluation 01/15/24    Authorization Type [employer]    OT Start Time 0803    OT Stop Time 0842    OT Time Calculation (min) 39 min    Activity Tolerance Patient tolerated treatment well;No increased pain;Patient limited by pain    Behavior During Therapy [facility] for tasks assessed/performed               Past Medical History:  Diagnosis Date   Heartburn    Prediabetes    History reviewed. No pertinent surgical history. Patient Active Problem List   Diagnosis Date Noted   Anxiety disorder 04/01/2014   Panic attacks 04/01/2014   Suicidal thoughts 04/01/2014    ONSET DATE: chronic onset ~4 years  REFERRING DIAG: M89.8X1 (ICD-10-CM) - Pain of left scapula   THERAPY DIAG:  Pain in left hand  Stiffness of left shoulder, not elsewhere classified  Paresthesia of skin  Muscle weakness (generalized)  Chronic left shoulder pain  Rationale for Evaluation and Treatment: Rehabilitation  PERTINENT HISTORY: "Left scapula pressure, discomfort; eval and treat for shoulder strengthening and stabilizing exercises "   "reports she has been having L hand weakness and numbness for about 4 yrs. She started having pain in her shoulder, facial numbness as well as arm numbness. Has seen chiropractor, current has discomfort in shoulder and L hand weakness " She states had seen chiropractor and developed some neck pain, though numbness did improve, she states having spasms in the shoulder blade that when they get "worked" she gets relieved of her pain and numbness. She states her Lt hand feels like wearing "a glove" at times.  She states her balance is off at times.   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  She has stopped working out and such out of fear. She also wakes up sore "every morning."  PRECAUTIONS: None  RED FLAGS: None   WEIGHT BEARING RESTRICTIONS: No   SUBJECTIVE:   SUBJECTIVE STATEMENT: She states feeling a bit better in general and reaching at the shoulder level is better, but having still some cramping the longer she sits at her desk at work.  This is definitely the exacerbating position of poor posture.    PAIN:  Are you having pain? Yes: NPRS scale:   0/10 at rest now, up to 4-5/10 at worst in past few weeks while at work Pain location: Infraspinatus, medial border of scapula on the left side, numbness down the anterior lateral forearm and shoulder Pain description: Mild with light paresthesia at times Aggravating factors: Work postures, certain sleeping postures Relieving factors: ?   PATIENT GOALS: To improve muscle spasm in her back as well as pain and paresthesia that also travels down her left arm  NEXT MD VISIT: As needed   OBJECTIVE: (All objective assessments below are from initial evaluation on: 12/01/23 unless otherwise specified.)   HAND DOMINANCE: Right   ADLs: Overall ADLs: States decreased ability to grab, hold household objects, lift arms above head or reach into tall cabinets, perform self-care/maintenance routines with weight lifting, etc.    FUNCTIONAL OUTCOME MEASURES: 12/22/23: PSFS: 5.5   Eval:  Patient Specific Functional Scale: 3 (pushups, lifting above head, sitting while working)  (Higher Score  =  Better Ability for the Selected Tasks)      UPPER EXTREMITY ROM     Shoulder to Wrist AROM Right eval Left eval Lt 12/22/23  Shoulder flexion 158 147 174  Shoulder abduction  159 166  Shoulder extension  68   Shoulder internal rotation  41 46  Shoulder external rotation  73 90  Elbow flexion     Elbow extension     Forearm supination     Forearm pronation      Wrist flexion  73 74   Wrist extension  85 87  Wrist ulnar deviation     Wrist radial deviation     Functional dart thrower's motion (F-DTM) in ulnar flexion     F-DTM in radial extension      (Blank rows = not tested)   Hand AROM Left eval  Full Fist Ability (or Gap to Distal Palmar Crease) full  Thumb Opposition  (Kapandji Scale)  full  (Blank rows = not tested)   UPPER EXTREMITY MMT:       MMT Left 12/01/23 Lt 12/22/23  Shoulder flexion    Shoulder abduction    Shoulder adduction    Shoulder extension    Shoulder internal rotation  5/5  Shoulder external rotation  4/5  Middle trapezius    Lower trapezius    Elbow flexion 5/5   Elbow extension 4/5 4/5  Forearm supination    Forearm pronation    Wrist flexion 5/5   Wrist extension 5/5   Wrist ulnar deviation    Wrist radial deviation    (Blank rows = not tested)  HAND FUNCTION: Eval: No observed weakness in affected Lt hand.  Grip strength Right: 79 lbs, Left: 88 lbs   SENSATION: Eval:  Light touch intact today, no paresthesia at rest  OBSERVATIONS:   Eval: Muscle spasms noted in infraspinatus on the left side as well as the teres group, as well as tender to the medial border of the scapula.  She presents as likely pinched axillary nerve in the quadrilateral space in the posterior shoulder and arm, and some traveling paresthesia to this nerve distribution and also weakness of the tricep.  Some radial nerve involvement as well obviously   TODAY'S TREATMENT:  12/22/23: Her range of motion shows greatly improved shoulder active range now, and is less symptomatic.  Unfortunately her tricep is still a bit weak and her external rotators are also weak.  To help with this, OT updates her strengthening program to include external rotation strength as well as serratus anterior strength.  She uses the UB E for warm up at 4.5 resistance for 4 minutes, then performs all of her stretches.  She then does her strengthening and then the new to  strengthening dynamic activities that are bolded below.  These are to stabilize the scapula and help with postural problems.  She tolerates these well, states fatigue but no pain, states understanding to continue to do these things multiple times a week even 2-3 times a day if she has the energy.  She states understanding and leaves without symptoms.   Exercises - Seated Scapular Retraction  - 2-3 x daily - 5 reps - 10 sec hold - Sleeper Stretch  - 2-3 x daily - 5 reps - 15-20 sec hold - Standing neck/upper traps stretch  - 2-3 x daily - 3-5 reps - 15 sec hold -  Standing Shoulder Abduction Slides at Wall  - 2-3 x daily - 1 sets - 3-5 reps - 15 sec hold - Tricep Stretch- DO SEATED BY TABLE  - 3-4 x daily - 3-5 reps - 15 hold - Standing Radial Nerve Glide  - 3 x daily - 5-10 reps - 2 sec hold - CLX Tricep Pressdowns  - 2-3 x daily - 4-5 x weekly - 1-2 sets - 10-15 reps - 3 sec hold - Scapular Retraction with Resistance Advanced  - 2-3 x daily - 4-5 x weekly - 1-2 sets - 10-15 reps - 3 sec hold - Shoulder External Rotation with Anchored Resistance  - 4-6 x daily - 1 sets - 10-15 reps - Wall Push Up with Plus  - 4-6 x daily - 1 sets - 10-15 reps  PATIENT EDUCATION: Education details: See tx section above for details  Person educated: Patient Education method: Verbal Instruction, Teach back, Handouts  Education comprehension: States and demonstrates understanding   HOME EXERCISE PROGRAM: Access Code: [email or web address] URL: [email or web address] Date: 12/01/2023 Prepared by: Leartis Proud   GOALS: Goals reviewed with patient? Yes   SHORT TERM GOALS: (STG required if POC>30 days) Target Date: 12/18/2023  Pt will demo/state understanding of initial HEP to improve pain levels and prerequisite motion. Goal status: 12/22/23   LONG TERM GOALS: Target Date: 01/15/24  Pt will improve functional ability by decreased impairment per PSFS  assessment from 3 to 6 or better, for better  quality of life. Goal status: INITIAL  2.  Pt will improve A/ROM in left shoulder internal rotation from 41 degrees to at least 55 degrees, to have functional motion for tasks like reach and grasp.  Goal status: INITIAL  3.  Pt will improve strength in left tricep from 4/5 MMT to at least 4+/5 MMT to have increased functional ability to carry out selfcare and higher-level homecare tasks with less difficulty. Goal status: INITIAL  4.  Pt will decrease pain at worst from 3-4/10 to 1/10 or better to have better sleep and occupational participation in daily roles. Goal status: INITIAL   ASSESSMENT:  CLINICAL IMPRESSION: 12/22/23: She is doing well though internal rotation is a bit tight and external rotation and triceps are a bit weak.  Will continue to work on this, but if she feels comfortable we can discharge in the next 1-2 visits if she understands the program  12/08/23: Tolerating strengthening portion well and nerve gliding was highly effective today.  Carry on  Eval: Patient is a 34 y.o. 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  She will benefit from outpatient occupational therapy to decrease the symptoms and increase quality of life.Aaron Aas     PLAN:  OT FREQUENCY: 1-2x/week  OT DURATION: 6 weeks through 01/15/2024 and up to 10 total visits as needed  PLANNED INTERVENTIONS: 97168 OT Re-evaluation, 97535 self care/ADL training, 54098 therapeutic exercise, 97530 therapeutic activity, 97112 neuromuscular re-education, 97140 manual therapy, 97035 ultrasound, 97010 moist heat, 97010 cryotherapy, 97760 Orthotic Initial, 97763 Orthotic/Prosthetic subsequent, Dry needling, coping strategies training, and patient/family education  RECOMMENDED OTHER SERVICES: none now   CONSULTED AND AGREED WITH PLAN OF CARE: Patient  PLAN FOR NEXT SESSION:    Continue on, check new strength, consider discharge when all goals are met and she understands the program   Lunabelle Oatley, OTR/L, CHT 12/22/2023, 8:46 AM

## 2023-12-22 ENCOUNTER — Encounter: Payer: Self-pay | Admitting: Rehabilitative and Restorative Service Providers"

## 2023-12-22 ENCOUNTER — Ambulatory Visit (INDEPENDENT_AMBULATORY_CARE_PROVIDER_SITE_OTHER): Admitting: Rehabilitative and Restorative Service Providers"

## 2023-12-22 DIAGNOSIS — G8929 Other chronic pain: Secondary | ICD-10-CM

## 2023-12-22 DIAGNOSIS — M25512 Pain in left shoulder: Secondary | ICD-10-CM

## 2023-12-22 DIAGNOSIS — M6281 Muscle weakness (generalized): Secondary | ICD-10-CM | POA: Diagnosis not present

## 2023-12-22 DIAGNOSIS — M79642 Pain in left hand: Secondary | ICD-10-CM | POA: Diagnosis not present

## 2023-12-22 DIAGNOSIS — M25612 Stiffness of left shoulder, not elsewhere classified: Secondary | ICD-10-CM

## 2023-12-22 DIAGNOSIS — R202 Paresthesia of skin: Secondary | ICD-10-CM

## 2023-12-28 NOTE — Therapy (Incomplete)
 OUTPATIENT OCCUPATIONAL THERAPY TREATMENT NOTE   Patient Name: Katrina Payne MRN: [id number] DOB:04-02-1991, 33 y.o., female Today's Date: 12/28/2023  PCP: Sherlean Dirk MD REFERRING PROVIDER:  Omega Bible, MD    END OF SESSION:      Past Medical History:  Diagnosis Date   Heartburn    Prediabetes    No past surgical history on file. Patient Active Problem List   Diagnosis Date Noted   Anxiety disorder 04/01/2014   Panic attacks 04/01/2014   Suicidal thoughts 04/01/2014    ONSET DATE: chronic onset ~4 years  REFERRING DIAG: M89.8X1 (ICD-10-CM) - Pain of left scapula   THERAPY DIAG:  No diagnosis found.  Rationale for Evaluation and Treatment: Rehabilitation  PERTINENT HISTORY: "Left scapula pressure, discomfort; eval and treat for shoulder strengthening and stabilizing exercises "   "reports she has been having L hand weakness and numbness for about 4 yrs. She started having pain in her shoulder, facial numbness as well as arm numbness. Has seen chiropractor, current has discomfort in shoulder and L hand weakness " She states had seen chiropractor and developed some neck pain, though numbness did improve, she states having spasms in the shoulder blade that when they get "worked" she gets relieved of her pain and numbness. She states her Lt hand feels like wearing "a glove" at times.  She states her balance is off at times.  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  She has stopped working out and such out of fear. She also wakes up sore "every morning."  PRECAUTIONS: None  RED FLAGS: None   WEIGHT BEARING RESTRICTIONS: No   SUBJECTIVE:   SUBJECTIVE STATEMENT: She states feeling a bit better in general and reaching at the shoulder level is better, but having still some cramping the longer she sits at her desk at work.  This is definitely the exacerbating position of poor posture.    PAIN:  Are you having pain? Yes: NPRS scale:   0/10 at rest now, up to  4-5/10 at worst in past few weeks while at work Pain location: Infraspinatus, medial border of scapula on the left side, numbness down the anterior lateral forearm and shoulder Pain description: Mild with light paresthesia at times Aggravating factors: Work postures, certain sleeping postures Relieving factors: ?   PATIENT GOALS: To improve muscle spasm in her back as well as pain and paresthesia that also travels down her left arm  NEXT MD VISIT: As needed   OBJECTIVE: (All objective assessments below are from initial evaluation on: 12/01/23 unless otherwise specified.)   HAND DOMINANCE: Right   ADLs: Overall ADLs: States decreased ability to grab, hold household objects, lift arms above head or reach into tall cabinets, perform self-care/maintenance routines with weight lifting, etc.    FUNCTIONAL OUTCOME MEASURES: 12/22/23: PSFS: 5.5   Eval: Patient Specific Functional Scale: 3 (pushups, lifting above head, sitting while working)  (Higher Score  =  Better Ability for the Selected Tasks)      UPPER EXTREMITY ROM     Shoulder to Wrist AROM Right eval Left eval Lt 12/22/23 LT 12/29/23  Shoulder flexion 158 147 174   Shoulder abduction  159 166   Shoulder extension  68    Shoulder internal rotation  41 46 ***  Shoulder external rotation  73 90 ***  Elbow flexion      Elbow extension      Forearm supination      Forearm pronation  Wrist flexion  73 74   Wrist extension  85 87   Wrist ulnar deviation      Wrist radial deviation      Functional dart thrower's motion (F-DTM) in ulnar flexion      F-DTM in radial extension       (Blank rows = not tested)   Hand AROM Left eval  Full Fist Ability (or Gap to Distal Palmar Crease) full  Thumb Opposition  (Kapandji Scale)  full  (Blank rows = not tested)   UPPER EXTREMITY MMT:       MMT Left 12/01/23 Lt 12/22/23 Lt 12/29/23  Shoulder flexion     Shoulder abduction     Shoulder adduction     Shoulder extension      Shoulder internal rotation  5/5   Shoulder external rotation  4/5 **/5  Middle trapezius     Lower trapezius     Elbow flexion 5/5    Elbow extension 4/5 4/5 ***/5  Forearm supination     Forearm pronation     Wrist flexion 5/5    Wrist extension 5/5    Wrist ulnar deviation     Wrist radial deviation     (Blank rows = not tested)  HAND FUNCTION: Eval: No observed weakness in affected Lt hand.  Grip strength Right: 79 lbs, Left: 88 lbs   SENSATION: Eval:  Light touch intact today, no paresthesia at rest  OBSERVATIONS:   Eval: Muscle spasms noted in infraspinatus on the left side as well as the teres group, as well as tender to the medial border of the scapula.  She presents as likely pinched axillary nerve in the quadrilateral space in the posterior shoulder and arm, and some traveling paresthesia to this nerve distribution and also weakness of the tricep.  Some radial nerve involvement as well obviously   TODAY'S TREATMENT:  12/29/23: *** Continue on, check new strength, consider discharge when all goals are met and she understands the program   12/22/23: Her range of motion shows greatly improved shoulder active range now, and is less symptomatic.  Unfortunately her tricep is still a bit weak and her external rotators are also weak.  To help with this, OT updates her strengthening program to include external rotation strength as well as serratus anterior strength.  She uses the UB E for warm up at 4.5 resistance for 4 minutes, then performs all of her stretches.  She then does her strengthening and then the new to strengthening dynamic activities that are bolded below.  These are to stabilize the scapula and help with postural problems.  She tolerates these well, states fatigue but no pain, states understanding to continue to do these things multiple times a week even 2-3 times a day if she has the energy.  She states understanding and leaves without symptoms.   Exercises - Seated  Scapular Retraction  - 2-3 x daily - 5 reps - 10 sec hold - Sleeper Stretch  - 2-3 x daily - 5 reps - 15-20 sec hold - Standing neck/upper traps stretch  - 2-3 x daily - 3-5 reps - 15 sec hold - Standing Shoulder Abduction Slides at Wall  - 2-3 x daily - 1 sets - 3-5 reps - 15 sec hold - Tricep Stretch- DO SEATED BY TABLE  - 3-4 x daily - 3-5 reps - 15 hold - Standing Radial Nerve Glide  - 3 x daily - 5-10 reps - 2 sec hold - CLX Tricep  Pressdowns  - 2-3 x daily - 4-5 x weekly - 1-2 sets - 10-15 reps - 3 sec hold - Scapular Retraction with Resistance Advanced  - 2-3 x daily - 4-5 x weekly - 1-2 sets - 10-15 reps - 3 sec hold - Shoulder External Rotation with Anchored Resistance  - 4-6 x daily - 1 sets - 10-15 reps - Wall Push Up with Plus  - 4-6 x daily - 1 sets - 10-15 reps  PATIENT EDUCATION: Education details: See tx section above for details  Person educated: Patient Education method: Verbal Instruction, Teach back, Handouts  Education comprehension: States and demonstrates understanding   HOME EXERCISE PROGRAM: Access Code: [email or web address] URL: [email or web address] Date: 12/01/2023 Prepared by: Leartis Proud   GOALS: Goals reviewed with patient? Yes   SHORT TERM GOALS: (STG required if POC>30 days) Target Date: 12/18/2023  Pt will demo/state understanding of initial HEP to improve pain levels and prerequisite motion. Goal status: 12/22/23   LONG TERM GOALS: Target Date: 01/15/24  Pt will improve functional ability by decreased impairment per PSFS  assessment from 3 to 6 or better, for better quality of life. Goal status: INITIAL  2.  Pt will improve A/ROM in left shoulder internal rotation from 41 degrees to at least 55 degrees, to have functional motion for tasks like reach and grasp.  Goal status: INITIAL  3.  Pt will improve strength in left tricep from 4/5 MMT to at least 4+/5 MMT to have increased functional ability to carry out selfcare and higher-level  homecare tasks with less difficulty. Goal status: INITIAL  4.  Pt will decrease pain at worst from 3-4/10 to 1/10 or better to have better sleep and occupational participation in daily roles. Goal status: INITIAL   ASSESSMENT:  CLINICAL IMPRESSION: 12/29/23: ***  12/22/23: She is doing well though internal rotation is a bit tight and external rotation and triceps are a bit weak.  Will continue to work on this, but if she feels comfortable we can discharge in the next 1-2 visits if she understands the program  12/08/23: Tolerating strengthening portion well and nerve gliding was highly effective today.  Carry on  Eval: Patient is a 33 y.o. 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  She will benefit from outpatient occupational therapy to decrease the symptoms and increase quality of life.Aaron Aas     PLAN:  OT FREQUENCY: 1-2x/week  OT DURATION: 6 weeks through 01/15/2024 and up to 10 total visits as needed  PLANNED INTERVENTIONS: 97168 OT Re-evaluation, 97535 self care/ADL training, 47829 therapeutic exercise, 97530 therapeutic activity, 97112 neuromuscular re-education, 97140 manual therapy, 97035 ultrasound, 97010 moist heat, 97010 cryotherapy, 97760 Orthotic Initial, 97763 Orthotic/Prosthetic subsequent, Dry needling, coping strategies training, and patient/family education  RECOMMENDED OTHER SERVICES: none now   CONSULTED AND AGREED WITH PLAN OF CARE: Patient  PLAN FOR NEXT SESSION:   ***   Swati Granberry, OTR/L, CHT 12/28/2023, 8:09 AM

## 2023-12-29 ENCOUNTER — Encounter: Admitting: Rehabilitative and Restorative Service Providers"

## 2024-01-01 NOTE — Therapy (Incomplete)
 OUTPATIENT OCCUPATIONAL THERAPY TREATMENT NOTE   Patient Name: Katrina Payne MRN: [id number] DOB:26-Nov-1990, 33 y.o., female Today's Date: 01/01/2024  PCP: Sherlean Dirk MD REFERRING PROVIDER:  Omega Bible, MD    END OF SESSION:      Past Medical History:  Diagnosis Date   Heartburn    Prediabetes    No past surgical history on file. Patient Active Problem List   Diagnosis Date Noted   Anxiety disorder 04/01/2014   Panic attacks 04/01/2014   Suicidal thoughts 04/01/2014    ONSET DATE: chronic onset ~4 years  REFERRING DIAG: M89.8X1 (ICD-10-CM) - Pain of left scapula   THERAPY DIAG:  No diagnosis found.  Rationale for Evaluation and Treatment: Rehabilitation  PERTINENT HISTORY: "Left scapula pressure, discomfort; eval and treat for shoulder strengthening and stabilizing exercises "   "reports she has been having L hand weakness and numbness for about 4 yrs. She started having pain in her shoulder, facial numbness as well as arm numbness. Has seen chiropractor, current has discomfort in shoulder and L hand weakness " She states had seen chiropractor and developed some neck pain, though numbness did improve, she states having spasms in the shoulder blade that when they get "worked" she gets relieved of her pain and numbness. She states her Lt hand feels like wearing "a glove" at times.  She states her balance is off at times.  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  She has stopped working out and such out of fear. She also wakes up sore "every morning."  PRECAUTIONS: None  RED FLAGS: None   WEIGHT BEARING RESTRICTIONS: No   SUBJECTIVE:   SUBJECTIVE STATEMENT: She states ***    feeling a bit better in general and reaching at the shoulder level is better, but having still some cramping the longer she sits at her desk at work.  This is definitely the exacerbating position of poor posture.    PAIN:  Are you having pain? Yes: NPRS scale:  ***  0/10 at rest  now, up to 4-5/10 at worst in past few weeks while at work Pain location: Infraspinatus, medial border of scapula on the left side, numbness down the anterior lateral forearm and shoulder Pain description: Mild with light paresthesia at times Aggravating factors: Work postures, certain sleeping postures Relieving factors: ?   PATIENT GOALS: To improve muscle spasm in her back as well as pain and paresthesia that also travels down her left arm  NEXT MD VISIT: As needed   OBJECTIVE: (All objective assessments below are from initial evaluation on: 12/01/23 unless otherwise specified.)   HAND DOMINANCE: Right   ADLs: Overall ADLs: States decreased ability to grab, hold household objects, lift arms above head or reach into tall cabinets, perform self-care/maintenance routines with weight lifting, etc.    FUNCTIONAL OUTCOME MEASURES: 12/22/23: PSFS: 5.5   Eval: Patient Specific Functional Scale: 3 (pushups, lifting above head, sitting while working)  (Higher Score  =  Better Ability for the Selected Tasks)      UPPER EXTREMITY ROM     Shoulder to Wrist AROM Right eval Left eval Lt 12/22/23 LT 01/05/24  Shoulder flexion 158 147 174   Shoulder abduction  159 166   Shoulder extension  68    Shoulder internal rotation  41 46 ***  Shoulder external rotation  73 90 ***  Elbow flexion      Elbow extension      Forearm supination      Forearm pronation  Wrist flexion  73 74   Wrist extension  85 87   Wrist ulnar deviation      Wrist radial deviation      Functional dart thrower's motion (F-DTM) in ulnar flexion      F-DTM in radial extension       (Blank rows = not tested)   Hand AROM Left eval  Full Fist Ability (or Gap to Distal Palmar Crease) full  Thumb Opposition  (Kapandji Scale)  full  (Blank rows = not tested)   UPPER EXTREMITY MMT:       MMT Left 12/01/23 Lt 12/22/23 Lt 01/05/24  Shoulder flexion     Shoulder abduction     Shoulder adduction     Shoulder  extension     Shoulder internal rotation  5/5   Shoulder external rotation  4/5 **/5  Middle trapezius     Lower trapezius     Elbow flexion 5/5    Elbow extension 4/5 4/5 ***/5  Forearm supination     Forearm pronation     Wrist flexion 5/5    Wrist extension 5/5    Wrist ulnar deviation     Wrist radial deviation     (Blank rows = not tested)  HAND FUNCTION: Eval: No observed weakness in affected Lt hand.  Grip strength Right: 79 lbs, Left: 88 lbs   SENSATION: Eval:  Light touch intact today, no paresthesia at rest  OBSERVATIONS:   Eval: Muscle spasms noted in infraspinatus on the left side as well as the teres group, as well as tender to the medial border of the scapula.  She presents as likely pinched axillary nerve in the quadrilateral space in the posterior shoulder and arm, and some traveling paresthesia to this nerve distribution and also weakness of the tricep.  Some radial nerve involvement as well obviously   TODAY'S TREATMENT:  01/05/24: *** Continue on, check new strength, consider discharge when all goals are met and she understands the program   12/22/23: Her range of motion shows greatly improved shoulder active range now, and is less symptomatic.  Unfortunately her tricep is still a bit weak and her external rotators are also weak.  To help with this, OT updates her strengthening program to include external rotation strength as well as serratus anterior strength.  She uses the UB E for warm up at 4.5 resistance for 4 minutes, then performs all of her stretches.  She then does her strengthening and then the new to strengthening dynamic activities that are bolded below.  These are to stabilize the scapula and help with postural problems.  She tolerates these well, states fatigue but no pain, states understanding to continue to do these things multiple times a week even 2-3 times a day if she has the energy.  She states understanding and leaves without  symptoms.   Exercises - Seated Scapular Retraction  - 2-3 x daily - 5 reps - 10 sec hold - Sleeper Stretch  - 2-3 x daily - 5 reps - 15-20 sec hold - Standing neck/upper traps stretch  - 2-3 x daily - 3-5 reps - 15 sec hold - Standing Shoulder Abduction Slides at Wall  - 2-3 x daily - 1 sets - 3-5 reps - 15 sec hold - Tricep Stretch- DO SEATED BY TABLE  - 3-4 x daily - 3-5 reps - 15 hold - Standing Radial Nerve Glide  - 3 x daily - 5-10 reps - 2 sec hold - CLX Tricep  Pressdowns  - 2-3 x daily - 4-5 x weekly - 1-2 sets - 10-15 reps - 3 sec hold - Scapular Retraction with Resistance Advanced  - 2-3 x daily - 4-5 x weekly - 1-2 sets - 10-15 reps - 3 sec hold - Shoulder External Rotation with Anchored Resistance  - 4-6 x daily - 1 sets - 10-15 reps - Wall Push Up with Plus  - 4-6 x daily - 1 sets - 10-15 reps  PATIENT EDUCATION: Education details: See tx section above for details  Person educated: Patient Education method: Verbal Instruction, Teach back, Handouts  Education comprehension: States and demonstrates understanding   HOME EXERCISE PROGRAM: Access Code: [email or web address] URL: [email or web address] Date: 12/01/2023 Prepared by: Leartis Proud   GOALS: Goals reviewed with patient? Yes   SHORT TERM GOALS: (STG required if POC>30 days) Target Date: 12/18/2023  Pt will demo/state understanding of initial HEP to improve pain levels and prerequisite motion. Goal status: 12/22/23   LONG TERM GOALS: Target Date: 01/15/24  Pt will improve functional ability by decreased impairment per PSFS  assessment from 3 to 6 or better, for better quality of life. Goal status: INITIAL  2.  Pt will improve A/ROM in left shoulder internal rotation from 41 degrees to at least 55 degrees, to have functional motion for tasks like reach and grasp.  Goal status: INITIAL  3.  Pt will improve strength in left tricep from 4/5 MMT to at least 4+/5 MMT to have increased functional ability to  carry out selfcare and higher-level homecare tasks with less difficulty. Goal status: INITIAL  4.  Pt will decrease pain at worst from 3-4/10 to 1/10 or better to have better sleep and occupational participation in daily roles. Goal status: INITIAL   ASSESSMENT:  CLINICAL IMPRESSION: 01/05/24: ***  12/22/23: She is doing well though internal rotation is a bit tight and external rotation and triceps are a bit weak.  Will continue to work on this, but if she feels comfortable we can discharge in the next 1-2 visits if she understands the program  12/08/23: Tolerating strengthening portion well and nerve gliding was highly effective today.  Carry on  Eval: Patient is a 33 y.o. 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  She will benefit from outpatient occupational therapy to decrease the symptoms and increase quality of life.Aaron Aas     PLAN:  OT FREQUENCY: 1-2x/week  OT DURATION: 6 weeks through 01/15/2024 and up to 10 total visits as needed  PLANNED INTERVENTIONS: 97168 OT Re-evaluation, 97535 self care/ADL training, 47829 therapeutic exercise, 97530 therapeutic activity, 97112 neuromuscular re-education, 97140 manual therapy, 97035 ultrasound, 97010 moist heat, 97010 cryotherapy, 97760 Orthotic Initial, 97763 Orthotic/Prosthetic subsequent, Dry needling, coping strategies training, and patient/family education  RECOMMENDED OTHER SERVICES: none now   CONSULTED AND AGREED WITH PLAN OF CARE: Patient  PLAN FOR NEXT SESSION:   ***   Tsion Inghram, OTR/L, CHT 01/01/2024, 12:06 PM

## 2024-01-04 ENCOUNTER — Telehealth: Payer: Self-pay | Admitting: Rehabilitative and Restorative Service Providers"

## 2024-01-04 NOTE — Telephone Encounter (Signed)
 She was called as she has not been seen in OT for over 2 weeks, has been cancelling her appointments.  She apologizes and states that she has just been very busy at work.  She states that the stretches do seem to help, but work continues to exacerbate her symptoms.  She does plan on coming in for her next appointment next Tuesday.

## 2024-01-05 ENCOUNTER — Encounter: Admitting: Rehabilitative and Restorative Service Providers"

## 2024-01-08 NOTE — Therapy (Signed)
 OUTPATIENT OCCUPATIONAL THERAPY TREATMENT & DISCHARGE NOTE   Patient Name: Katrina Payne MRN: [id number] DOB:22-Mar-1991, 33 y.o., female Today's Date: 01/12/2024  PCP: Stephanie BATTLE MD REFERRING PROVIDER:  Margaret Eduard SAUNDERS, MD               OCCUPATIONAL THERAPY DISCHARGE SUMMARY  Visits from Start of Care: 4  Current functional level related to goals / functional outcomes: 01/12/24: She has met all of her long-term goals now except for pain ratings which are less frequent but still happen during work with prolonged postures and positions.  We discussed how she can try to mitigate complications at work and with exercise routines, and OT encourages her that she is doing better in terms of her strength and tolerance, and she needs to continue to give it more time and consistency and also very her exercises and activities so that she is not stuck in 1 position position or posture too much or too often.  She states understanding and agrees to discharge therapy today.  Education / Equipment: Pt has all needed materials and education. Pt understands how to continue on with self-management. See tx notes for more details.   Patient agrees to discharge due to max benefits received from outpatient occupational therapy / hand therapy at this time.   Melvenia Ada, OTR/L, CHT 01/12/24            END OF SESSION:  OT End of Session - 01/12/24 0806     Visit Number 4    Number of Visits 10    Date for OT Re-Evaluation 01/15/24    Authorization Type [employer]    OT Start Time 9193    OT Stop Time 0845    OT Time Calculation (min) 39 min    Activity Tolerance Patient tolerated treatment well;No increased pain;Patient limited by pain    Behavior During Therapy [facility] for tasks assessed/performed             Past Medical History:  Diagnosis Date   Heartburn    Prediabetes    History reviewed. No pertinent surgical history. Patient Active Problem List   Diagnosis Date  Noted   Anxiety disorder 04/01/2014   Panic attacks 04/01/2014   Suicidal thoughts 04/01/2014    ONSET DATE: chronic onset ~4 years  REFERRING DIAG: M89.8X1 (ICD-10-CM) - Pain of left scapula   THERAPY DIAG:  Pain in left hand  Stiffness of left shoulder, not elsewhere classified  Paresthesia of skin  Muscle weakness (generalized)  Chronic left shoulder pain  Rationale for Evaluation and Treatment: Rehabilitation  PERTINENT HISTORY: Left scapula pressure, discomfort; eval and treat for shoulder strengthening and stabilizing exercises    reports she has been having L hand weakness and numbness for about 4 yrs. She started having pain in her shoulder, facial numbness as well as arm numbness. Has seen chiropractor, current has discomfort in shoulder and L hand weakness  She states had seen chiropractor and developed some neck pain, though numbness did improve, she states having spasms in the shoulder blade that when they get worked she gets relieved of her pain and numbness. She states her Lt hand feels like wearing a glove at times.  She states her balance is off at times.  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  She has stopped working out and such out of fear. She also wakes up sore every morning.  PRECAUTIONS: None  RED FLAGS: None   WEIGHT BEARING RESTRICTIONS: No   SUBJECTIVE:  SUBJECTIVE STATEMENT: She has not come back to therapy for about 2 to 3 weeks because she has been busy at work.  She states that work is still exacerbating. She states she is able to do strengthening 1-2 every day.     PAIN:  Are you having pain? Yes: NPRS scale:    0/10 at rest now, up to 4-5/10 at worst in past few weeks while at work Pain location: Infraspinatus, medial border of scapula on the left side, numbness down the anterior lateral forearm and shoulder Pain description: Mild with light paresthesia at times Aggravating factors: Work postures, certain sleeping  postures Relieving factors: ?   PATIENT GOALS: To improve muscle spasm in her back as well as pain and paresthesia that also travels down her left arm  NEXT MD VISIT: As needed   OBJECTIVE: (All objective assessments below are from initial evaluation on: 12/01/23 unless otherwise specified.)   HAND DOMINANCE: Right   ADLs: Overall ADLs: States decreased ability to grab, hold household objects, lift arms above head or Payne into tall cabinets, perform self-care/maintenance routines with weight lifting, etc.    FUNCTIONAL OUTCOME MEASURES: 01/12/24: PSFS: 7.3  12/22/23: PSFS: 5.5  Eval: Patient Specific Functional Scale: 3 (pushups, lifting above head, sitting while working)  (Higher Score  =  Better Ability for the Selected Tasks)      UPPER EXTREMITY ROM     Shoulder to Wrist AROM Right eval Left eval Lt 12/22/23 LT 01/12/24  Shoulder flexion 158 147 174 155  Shoulder abduction  159 166 157  Shoulder extension  68  51  Shoulder internal rotation  41 46 54  Shoulder external rotation  73 90 90  Elbow flexion      Elbow extension      Forearm supination      Forearm pronation       Wrist flexion  73 74 79  Wrist extension  85 87 75  Wrist ulnar deviation      Wrist radial deviation      Functional dart thrower's motion (F-DTM) in ulnar flexion      F-DTM in radial extension       (Blank rows = not tested)   Hand AROM Left eval  Full Fist Ability (or Gap to Distal Palmar Crease) full  Thumb Opposition  (Kapandji Scale)  full  (Blank rows = not tested)   UPPER EXTREMITY MMT:       MMT Left 12/01/23 Lt 12/22/23 Lt 01/12/24  Shoulder flexion     Shoulder abduction     Shoulder adduction     Shoulder extension     Shoulder internal rotation  5/5 5/5  Shoulder external rotation  4/5 4+/5  Middle trapezius     Lower trapezius     Elbow flexion 5/5    Elbow extension 4/5 4/5 4+/5  Forearm supination     Forearm pronation     Wrist flexion 5/5    Wrist  extension 5/5    Wrist ulnar deviation     Wrist radial deviation     (Blank rows = not tested)  HAND FUNCTION: 01/12/24: Grip Rt: 84#; Lt: 80#   Eval: No observed weakness in affected Lt hand.  Grip strength Right: 79 lbs, Left: 88 lbs   SENSATION: Eval:  Light touch intact today, no paresthesia at rest  OBSERVATIONS:   Eval: Muscle spasms noted in infraspinatus on the left side as well as the teres group, as well as tender  to the medial border of the scapula.  She presents as likely pinched axillary nerve in the quadrilateral space in the posterior shoulder and arm, and some traveling paresthesia to this nerve distribution and also weakness of the tricep.  Some radial nerve involvement as well obviously   TODAY'S TREATMENT:  01/12/24: Pt performs AROM, gripping, and strength with Lt hand/arm against therapist's resistance for exercise/activities as well as new measures today. OT also discusses home and functional tasks with the pt and reviews goals. Using the complied data, OT also reviews home exercises as listed below and provides updated recommendations including modifying workstation, postures, sleeping routines, exercise routines, etc. she does state that in the past she had done repetitive exercises and only certain positions which could have certainly led to her imbalance.   Exercises - Seated Scapular Retraction  - 2-3 x daily - 5 reps - 10 sec hold - Sleeper Stretch  - 2-3 x daily - 5 reps - 15-20 sec hold - Standing neck/upper traps stretch  - 2-3 x daily - 3-5 reps - 15 sec hold - Standing Shoulder Abduction Slides at Wall  - 2-3 x daily - 1 sets - 3-5 reps - 15 sec hold - Tricep Stretch- DO SEATED BY TABLE  - 3-4 x daily - 3-5 reps - 15 hold - Standing Radial Nerve Glide  - 3 x daily - 5-10 reps - 2 sec hold - CLX Tricep Pressdowns  - 2-3 x daily - 4-5 x weekly - 1-2 sets - 10-15 reps - 3 sec hold - Scapular Retraction with Resistance Advanced  - 2-3 x daily - 4-5 x weekly -  1-2 sets - 10-15 reps - 3 sec hold - Shoulder External Rotation with Anchored Resistance  - 4-6 x daily - 1 sets - 10-15 reps - Wall Push Up with Plus  - 4-6 x daily - 1 sets - 10-15 reps  PATIENT EDUCATION: Education details: See tx section above for details  Person educated: Patient Education method: Verbal Instruction, Teach back, Handouts  Education comprehension: States and demonstrates understanding   HOME EXERCISE PROGRAM: Access Code: [email or web address] URL: [email or web address] Date: 12/01/2023 Prepared by: Melvenia Ada   GOALS: Goals reviewed with patient? Yes   SHORT TERM GOALS: (STG required if POC>30 days) Target Date: 12/18/2023  Pt will demo/state understanding of initial HEP to improve pain levels and prerequisite motion. Goal status: 12/22/23   LONG TERM GOALS: Target Date: 01/15/24  Pt will improve functional ability by decreased impairment per PSFS  assessment from 3 to 6 or better, for better quality of life. Goal status: 01/12/24: MET  2.  Pt will improve A/ROM in left shoulder internal rotation from 41 degrees to at least 55 degrees, to have functional motion for tasks like Payne and grasp.  Goal status: 01/12/24: MET  3.  Pt will improve strength in left tricep from 4/5 MMT to at least 4+/5 MMT to have increased functional ability to carry out selfcare and higher-level homecare tasks with less difficulty. Goal status: 01/12/24: MET  4.  Pt will decrease pain at worst from 3-4/10 to 1/10 or better to have better sleep and occupational participation in daily roles. Goal status: 01/12/24: Not Met but overall states doing better    ASSESSMENT:  CLINICAL IMPRESSION: 01/12/24: She has met all of her long-term goals now except for pain ratings which are less frequent but still happen during work with prolonged postures and positions.  We discussed how she can try to mitigate complications  at work and with exercise routines, and OT encourages her that  she is doing better in terms of her strength and tolerance, and she needs to continue to give it more time and consistency and also very her exercises and activities so that she is not stuck in 1 position position or posture too much or too often.  She states understanding and agrees to discharge therapy today.     PLAN:  OT FREQUENCY:  D/C  OT DURATION: DC  PLANNED INTERVENTIONS: 97168 OT Re-evaluation, 97535 self care/ADL training, 02889 therapeutic exercise, 97530 therapeutic activity, 97112 neuromuscular re-education, 97140 manual therapy, 97035 ultrasound, 97010 moist heat, 97010 cryotherapy, 97760 Orthotic Initial, 97763 Orthotic/Prosthetic subsequent, Dry needling, coping strategies training, and patient/family education  RECOMMENDED OTHER SERVICES: none now   CONSULTED AND AGREED WITH PLAN OF CARE: Patient  PLAN FOR NEXT SESSION:   N/A, DC   Shamyia Grandpre, OTR/L, CHT 01/12/2024, 12:53 PM

## 2024-01-12 ENCOUNTER — Encounter: Payer: Self-pay | Admitting: Rehabilitative and Restorative Service Providers"

## 2024-01-12 ENCOUNTER — Ambulatory Visit (INDEPENDENT_AMBULATORY_CARE_PROVIDER_SITE_OTHER): Admitting: Rehabilitative and Restorative Service Providers"

## 2024-01-12 DIAGNOSIS — M6281 Muscle weakness (generalized): Secondary | ICD-10-CM | POA: Diagnosis not present

## 2024-01-12 DIAGNOSIS — G8929 Other chronic pain: Secondary | ICD-10-CM

## 2024-01-12 DIAGNOSIS — M25512 Pain in left shoulder: Secondary | ICD-10-CM

## 2024-01-12 DIAGNOSIS — M79642 Pain in left hand: Secondary | ICD-10-CM

## 2024-01-12 DIAGNOSIS — M25612 Stiffness of left shoulder, not elsewhere classified: Secondary | ICD-10-CM

## 2024-01-12 DIAGNOSIS — R202 Paresthesia of skin: Secondary | ICD-10-CM | POA: Diagnosis not present

## 2024-04-04 ENCOUNTER — Encounter: Payer: Self-pay | Admitting: General Practice

## 2024-04-04 ENCOUNTER — Ambulatory Visit: Admitting: General Practice

## 2024-04-04 VITALS — BP 120/76 | HR 88 | Temp 98.2°F | Ht 64.9 in | Wt 186.8 lb

## 2024-04-04 DIAGNOSIS — E042 Nontoxic multinodular goiter: Secondary | ICD-10-CM

## 2024-04-04 DIAGNOSIS — F419 Anxiety disorder, unspecified: Secondary | ICD-10-CM

## 2024-04-04 DIAGNOSIS — D500 Iron deficiency anemia secondary to blood loss (chronic): Secondary | ICD-10-CM

## 2024-04-04 DIAGNOSIS — E7800 Pure hypercholesterolemia, unspecified: Secondary | ICD-10-CM

## 2024-04-04 DIAGNOSIS — Z114 Encounter for screening for human immunodeficiency virus [HIV]: Secondary | ICD-10-CM

## 2024-04-04 DIAGNOSIS — K219 Gastro-esophageal reflux disease without esophagitis: Secondary | ICD-10-CM

## 2024-04-04 DIAGNOSIS — Z833 Family history of diabetes mellitus: Secondary | ICD-10-CM

## 2024-04-04 DIAGNOSIS — Z1159 Encounter for screening for other viral diseases: Secondary | ICD-10-CM

## 2024-04-04 DIAGNOSIS — R7303 Prediabetes: Secondary | ICD-10-CM

## 2024-04-04 DIAGNOSIS — Z7689 Persons encountering health services in other specified circumstances: Secondary | ICD-10-CM | POA: Insufficient documentation

## 2024-04-04 DIAGNOSIS — Z0000 Encounter for general adult medical examination without abnormal findings: Secondary | ICD-10-CM | POA: Diagnosis not present

## 2024-04-04 DIAGNOSIS — F410 Panic disorder [episodic paroxysmal anxiety] without agoraphobia: Secondary | ICD-10-CM

## 2024-04-04 MED ORDER — OMEPRAZOLE 20 MG PO CPDR: 20.0000 mg | DELAYED_RELEASE_CAPSULE | Freq: Every day | ORAL | 0 refills | Status: DC

## 2024-04-04 NOTE — Assessment & Plan Note (Signed)
 EMR reviewed briefly.

## 2024-04-04 NOTE — Progress Notes (Signed)
 New Patient Office Visit  Subjective    Patient ID: Katrina Payne, female    DOB: 04-05-1991  Age: 33 y.o. MRN: [id number]  CC:  Chief Complaint  Patient presents with   New Patient (Initial Visit)    HPI , Katrina Payne is a 33 y.o. female presents to establish care, for complete physical and follow up of chronic conditions.  Previous PCP: [facility]. Last physical and labs- 2024.   GERD: diagnosed three years ago.  Currently managed on Omeprazole  20 mg once daily. She needs a refill.   Anemia: diagnosed a few years ago. Start ferrous sulfate 325 mg once daily for the past year. No recent blood work. She does get constipation at times but no concerns today.   Anxiety/panic attacks: diagnosed a few years ago. She did therapy two years ago.   Immunizations: -Tetanus: Completed in 2017 -Influenza: declines  Diet: Fair diet.  Exercise: regular exercise.  Eye exam: Completes annually  Dental exam: Completes semi-annually    Pap Smear: Completed in 2023     Outpatient Encounter Medications as of 04/04/2024  Medication Sig   ferrous sulfate 325 (65 FE) MG tablet Take 325 mg by mouth daily with breakfast.   [DISCONTINUED] omeprazole  (PRILOSEC) 20 MG capsule Take 20 mg by mouth daily.   omeprazole  (PRILOSEC) 20 MG capsule Take 1 capsule (20 mg total) by mouth daily.   No facility-administered encounter medications on file as of 04/04/2024.    Past Medical History:  Diagnosis Date   Depression    Heartburn    History of chicken pox    Prediabetes     History reviewed. No pertinent surgical history.  Family History  Problem Relation Age of Onset   Hyperlipidemia Mother    Depression Mother    COPD Mother    Alcohol abuse Mother    Hypertension Mother    Drug abuse Father    Depression Father    Alcohol abuse Father    Kidney Stones Father    Stroke Maternal Grandfather    Hyperlipidemia Maternal Grandfather    Hearing loss Maternal Grandfather    Alcohol  abuse Maternal Grandfather    Heart attack Maternal Grandfather    Stroke Paternal Grandfather    Diabetes Paternal Grandfather     Social History   Socioeconomic History   Marital status: Single    Spouse name: Not on file   Number of children: Not on file   Years of education: Not on file   Highest education level: Not on file  Occupational History   Not on file  Tobacco Use   Smoking status: Every Day    Current packs/day: 0.50    Types: Cigarettes   Smokeless tobacco: Never  Vaping Use   Vaping status: Never Used  Substance and Sexual Activity   Alcohol use: Not Currently   Drug use: No   Sexual activity: Not Currently  Other Topics Concern   Not on file  Social History Narrative   Not on file   Social Drivers of Health   Financial Resource Strain: Not on file  Food Insecurity: Not on file  Transportation Needs: Not on file  Physical Activity: Not on file  Stress: Not on file  Social Connections: Unknown (12/10/2021)   Received from [facility]   Social Network    Social Network: Not on file  Intimate Partner Violence: Unknown (11/01/2021)   Received [facility]   HITS    Physically  Hurt: Not on file    Insult or Talk Down To: Not on file    Threaten Physical Harm: Not on file    Scream or Curse: Not on file    Review of Systems  Constitutional:  Negative for chills, fever, malaise/fatigue and weight loss.  HENT:  Negative for congestion, ear discharge, ear pain, hearing loss, nosebleeds, sinus pain, sore throat and tinnitus.   Eyes:  Negative for blurred vision, double vision, pain, discharge and redness.  Respiratory:  Negative for cough, shortness of breath, wheezing and stridor.   Cardiovascular:  Negative for chest pain, palpitations and leg swelling.  Gastrointestinal:  Negative for abdominal pain, constipation, diarrhea, heartburn, nausea and vomiting.  Genitourinary:  Negative for dysuria, frequency and urgency.  Musculoskeletal:  Negative  for myalgias.  Skin:  Negative for rash.  Neurological:  Negative for dizziness, tingling, seizures, weakness and headaches.  Psychiatric/Behavioral:  Negative for depression, substance abuse and suicidal ideas. The patient is nervous/anxious.         Objective    BP 120/76   Pulse 88   Temp 98.2 F (36.8 C) (Oral)   Ht 5' 4.9 (1.648 m)   Wt 186 lb 12.8 oz (84.7 kg)   LMP 03/28/2024 (Approximate)   SpO2 98%   BMI 31.18 kg/m   Physical Exam Vitals and nursing note reviewed.  Constitutional:      Appearance: Normal appearance.  HENT:     Head: Normocephalic and atraumatic.     Right Ear: Tympanic membrane, ear canal and external ear normal.     Left Ear: Tympanic membrane, ear canal and external ear normal.     Nose: Nose normal.     Mouth/Throat:     Mouth: Mucous membranes are moist.     Pharynx: Oropharynx is clear.  Eyes:     Conjunctiva/sclera: Conjunctivae normal.     Pupils: Pupils are equal, round, and reactive to light.  Cardiovascular:     Rate and Rhythm: Normal rate and regular rhythm.     Pulses: Normal pulses.     Heart sounds: Normal heart sounds.  Pulmonary:     Effort: Pulmonary effort is normal.     Breath sounds: Normal breath sounds.  Abdominal:     General: Abdomen is flat. Bowel sounds are normal.     Palpations: Abdomen is soft.  Musculoskeletal:        General: Normal range of motion.     Cervical back: Normal range of motion.  Skin:    General: Skin is warm and dry.     Capillary Refill: Capillary refill takes less than 2 seconds.  Neurological:     General: No focal deficit present.     Mental Status: She is alert and oriented to person, place, and time. Mental status is at baseline.  Psychiatric:        Mood and Affect: Mood normal.        Behavior: Behavior normal.        Thought Content: Thought content normal.        Judgment: Judgment normal.         Assessment & Plan:  Encounter for screening and preventative  care Assessment & Plan: Immunizations declines influenza vaccine. Pap smear UTD.  Discussed the importance of a healthy diet and regular exercise in order for weight loss, and to reduce the risk of further co-morbidity.  Exam stable. Labs pending.  Follow up in 1 year for repeat physical.   Orders: -  CBC; Future  Establishing care with new doctor, encounter for Assessment & Plan: EMR reviewed briefly.    Gastroesophageal reflux disease, unspecified whether esophagitis present Assessment & Plan: Controlled.  Continue omeprazole  20 mg. Refill sent.   Orders: -     Omeprazole ; Take 1 capsule (20 mg total) by mouth daily.  Dispense: 90 capsule; Refill: 0  Family history of diabetes mellitus Assessment & Plan: Hemoglobin A1c pending.   Prediabetes Assessment & Plan: Hemoglobin A1c pending.  Orders: -     Hemoglobin A1c; Future  High cholesterol Assessment & Plan: Lipid panel pending.  Orders: -     Lipid panel; Future  Multiple thyroid  nodules Assessment & Plan: Following with endocrinology.  TSH pending.  Orders: -     TSH; Future  Iron deficiency anemia due to chronic blood loss Assessment & Plan: Cbc pending.  Continue ferrous sulfate.  Orders: -     CBC; Future -     Iron, TIBC and Ferritin Panel; Future  Need for hepatitis C screening test -     Hepatitis C antibody; Future  Screening for HIV (human immunodeficiency virus) -     HIV Antibody (routine testing w rflx); Future  Anxiety disorder, unspecified type Assessment & Plan: Controlled.  No concerns today.   Panic attacks Assessment & Plan: Controlled.       Return in about 4 weeks (around 05/02/2024) for anxiety/palpitations.   Carrol Aurora, NP

## 2024-04-04 NOTE — Assessment & Plan Note (Signed)
 Hemoglobin A1c pending

## 2024-04-04 NOTE — Assessment & Plan Note (Signed)
 Cbc pending.  Continue ferrous sulfate.

## 2024-04-04 NOTE — Assessment & Plan Note (Addendum)
 Immunizations declines influenza vaccine. Pap smear UTD.  Discussed the importance of a healthy diet and regular exercise in order for weight loss, and to reduce the risk of further co-morbidity.  Exam stable. Labs pending.  Follow up in 1 year for repeat physical.

## 2024-04-04 NOTE — Assessment & Plan Note (Signed)
 Following with endocrinology.  TSH pending.

## 2024-04-04 NOTE — Patient Instructions (Signed)
 Schedule lab appointment on your way out.   Continue medications as prescribed.   Follow up in one year for physical.   It was a pleasure to meet you today! Please don't hesitate to contact me with any questions. Welcome to [facility]!

## 2024-04-04 NOTE — Assessment & Plan Note (Signed)
 Controlled.  Continue omeprazole  20 mg. Refill sent.

## 2024-04-04 NOTE — Assessment & Plan Note (Signed)
 Controlled.

## 2024-04-04 NOTE — Assessment & Plan Note (Signed)
 Controlled.  No concerns today.

## 2024-04-04 NOTE — Assessment & Plan Note (Signed)
 Lipid panel pending.

## 2024-04-06 ENCOUNTER — Ambulatory Visit: Payer: Self-pay | Admitting: General Practice

## 2024-04-06 ENCOUNTER — Other Ambulatory Visit

## 2024-04-06 DIAGNOSIS — E7800 Pure hypercholesterolemia, unspecified: Secondary | ICD-10-CM

## 2024-04-06 DIAGNOSIS — Z0000 Encounter for general adult medical examination without abnormal findings: Secondary | ICD-10-CM

## 2024-04-06 DIAGNOSIS — D500 Iron deficiency anemia secondary to blood loss (chronic): Secondary | ICD-10-CM | POA: Diagnosis not present

## 2024-04-06 DIAGNOSIS — R7303 Prediabetes: Secondary | ICD-10-CM | POA: Diagnosis not present

## 2024-04-06 DIAGNOSIS — Z114 Encounter for screening for human immunodeficiency virus [HIV]: Secondary | ICD-10-CM | POA: Diagnosis not present

## 2024-04-06 DIAGNOSIS — E042 Nontoxic multinodular goiter: Secondary | ICD-10-CM

## 2024-04-06 DIAGNOSIS — Z1159 Encounter for screening for other viral diseases: Secondary | ICD-10-CM

## 2024-04-06 LAB — CBC
HCT: 42.4 % (ref 36.0–46.0)
Hemoglobin: 14.2 g/dL (ref 12.0–15.0)
MCHC: 33.6 g/dL (ref 30.0–36.0)
MCV: 88.8 fl (ref 78.0–100.0)
Platelets: 286 K/uL (ref 150.0–400.0)
RBC: 4.77 Mil/uL (ref 3.87–5.11)
RDW: 12.5 % (ref 11.5–15.5)
WBC: 5.8 K/uL (ref 4.0–10.5)

## 2024-04-06 LAB — LIPID PANEL
Cholesterol: 184 mg/dL (ref 0–200)
HDL: 47.5 mg/dL (ref >39.00)
LDL Cholesterol: 114 mg/dL — ABNORMAL HIGH (ref 0–99)
NonHDL: 136.6
Total CHOL/HDL Ratio: 4
Triglycerides: 115 mg/dL (ref 0.0–149.0)
VLDL: 23 mg/dL (ref 0.0–40.0)

## 2024-04-06 LAB — IBC + FERRITIN
Ferritin: 30.9 ng/mL (ref 10.0–291.0)
Iron: 111 ug/dL (ref 42–145)
Saturation Ratios: 31.2 % (ref 20.0–50.0)
TIBC: 355.6 ug/dL (ref 250.0–450.0)
Transferrin: 254 mg/dL (ref 212.0–360.0)

## 2024-04-06 LAB — HEMOGLOBIN A1C: Hgb A1c MFr Bld: 5.4 % (ref 4.6–6.5)

## 2024-04-06 LAB — TSH: TSH: 1.03 u[IU]/mL (ref 0.35–5.50)

## 2024-04-07 LAB — HIV ANTIBODY (ROUTINE TESTING W REFLEX)
HIV 1&2 Ab, 4th Generation: NONREACTIVE
HIV FINAL INTERPRETATION: NEGATIVE

## 2024-04-07 LAB — HEPATITIS C ANTIBODY: Hepatitis C Ab: NONREACTIVE

## 2024-04-29 ENCOUNTER — Encounter: Payer: Self-pay | Admitting: General Practice

## 2024-05-02 ENCOUNTER — Ambulatory Visit: Admitting: General Practice

## 2024-05-02 DIAGNOSIS — F419 Anxiety disorder, unspecified: Secondary | ICD-10-CM

## 2024-05-27 ENCOUNTER — Ambulatory Visit: Admitting: General Practice

## 2024-05-30 ENCOUNTER — Ambulatory Visit: Admitting: General Practice

## 2024-05-30 ENCOUNTER — Ambulatory Visit: Attending: General Practice

## 2024-05-30 ENCOUNTER — Ambulatory Visit: Payer: Self-pay | Admitting: General Practice

## 2024-05-30 ENCOUNTER — Other Ambulatory Visit: Payer: Self-pay | Admitting: Otolaryngology

## 2024-05-30 VITALS — BP 116/78 | HR 75 | Temp 97.7°F | Ht 64.9 in | Wt 189.0 lb

## 2024-05-30 DIAGNOSIS — E041 Nontoxic single thyroid nodule: Secondary | ICD-10-CM | POA: Diagnosis not present

## 2024-05-30 DIAGNOSIS — E7800 Pure hypercholesterolemia, unspecified: Secondary | ICD-10-CM | POA: Diagnosis not present

## 2024-05-30 DIAGNOSIS — R002 Palpitations: Secondary | ICD-10-CM

## 2024-05-30 DIAGNOSIS — E042 Nontoxic multinodular goiter: Secondary | ICD-10-CM

## 2024-05-30 NOTE — Progress Notes (Signed)
 Established Patient Office Visit  Subjective   Patient ID: Katrina Payne, female    DOB: 11-Sep-1990  Age: 33 y.o. MRN: [id number]  Chief Complaint  Patient presents with   Anxiety    Patient following up on anxiety issues. Patient not taking any medication for this and states its better then it was a year ago.     Anxiety Symptoms include palpitations. Patient reports no chest pain, dizziness, nausea, nervous/anxious behavior, shortness of breath or suicidal ideas.      Katrina Payne is a 33 year old female with past medical history of GERD, thyroid  nodules, anxiety disorder, panic attacks, prediabetes, high cholesterol, IDA presents today for an anxiety.   Discussed the use of AI scribe software for clinical note transcription with the patient, who gave verbal consent to proceed.  History of Present Illness Katrina Payne is a 33 year old female with anxiety who presents with palpitations.  She experiences palpitations primarily in the mornings and evenings, especially when at rest or lying down to sleep. These episodes occur daily and are not necessarily associated with anxiety or panic attacks, as they happen even when she is not feeling anxious. Deep breathing helps alleviate the palpitations.  No chest pain, shortness of breath, difficulty breathing, leg swelling, dizziness, or headaches accompany the palpitations.  She has a history of anxiety and panic attacks. Her current GAD and PHQ scores are low.  She used to smoke cigarettes but quit over a year ago. Currently, she uses nicotine pouches daily.    Patient Active Problem List   Diagnosis Date Noted   Heart palpitations 05/30/2024   Establishing care with new doctor, encounter for 04/04/2024   Gastroesophageal reflux disease 04/04/2024   Encounter for screening and preventative care 04/04/2024   Family history of diabetes mellitus 04/04/2024   Prediabetes 04/04/2024   High cholesterol 04/04/2024   Multiple thyroid   nodules 04/04/2024   Iron deficiency anemia due to chronic blood loss 04/04/2024   Anxiety disorder 04/01/2014   Panic attacks 04/01/2014   Past Medical History:  Diagnosis Date   Depression    Heartburn    History of chicken pox    Prediabetes    History reviewed. No pertinent surgical history. Allergies  Allergen Reactions   Crab Extract          05/30/2024    2:25 PM 04/04/2024   12:32 PM  Depression screen PHQ 2/9  Decreased Interest 0 0  Down, Depressed, Hopeless 0 0  PHQ - 2 Score 0 0  Altered sleeping 0 0  Tired, decreased energy 0 0  Change in appetite 0 0  Feeling bad or failure about yourself  0 0  Trouble concentrating 0 0  Moving slowly or fidgety/restless 0 0  Suicidal thoughts 0 0  PHQ-9 Score 0 0  Difficult doing work/chores Not difficult at all Not difficult at all       05/30/2024    2:25 PM 04/04/2024   12:32 PM  GAD 7 : Generalized Anxiety Score  Nervous, Anxious, on Edge 1 2  Control/stop worrying 0 0  Worry too much - different things 0 0  Trouble relaxing 0 0  Restless 0 1  Easily annoyed or irritable 0 0  Afraid - awful might happen 0 0  Total GAD 7 Score 1 3  Anxiety Difficulty Not difficult at all Not difficult at all      Review of Systems  Constitutional:  Negative for chills and  fever.  Respiratory:  Negative for shortness of breath.   Cardiovascular:  Positive for palpitations. Negative for chest pain.  Gastrointestinal:  Negative for abdominal pain, constipation, diarrhea, heartburn, nausea and vomiting.  Genitourinary:  Negative for dysuria, frequency and urgency.  Neurological:  Negative for dizziness and headaches.  Endo/Heme/Allergies:  Negative for polydipsia.  Psychiatric/Behavioral:  Negative for depression and suicidal ideas. The patient is not nervous/anxious.       Objective:     BP 116/78   Pulse 75   Temp 97.7 F (36.5 C) (Temporal)   Ht 5' 4.9 (1.648 m)   Wt 189 lb (85.7 kg)   SpO2 99%   BMI 31.55 kg/m   BP Readings from Last 3 Encounters:  05/30/24 116/78  04/04/24 120/76  11/23/23 127/77   Wt Readings from Last 3 Encounters:  05/30/24 189 lb (85.7 kg)  04/04/24 186 lb 12.8 oz (84.7 kg)  11/23/23 192 lb (87.1 kg)      Physical Exam Vitals and nursing note reviewed.  Constitutional:      Appearance: Normal appearance.  Cardiovascular:     Rate and Rhythm: Normal rate and regular rhythm.     Pulses: Normal pulses.     Heart sounds: Normal heart sounds.  Pulmonary:     Effort: Pulmonary effort is normal.     Breath sounds: Normal breath sounds.  Neurological:     Mental Status: She is alert and oriented to person, place, and time.  Psychiatric:        Mood and Affect: Mood normal.        Behavior: Behavior normal.        Thought Content: Thought content normal.        Judgment: Judgment normal.      No results found for any visits on 05/30/24.     The ASCVD Risk score (Arnett DK, et al., 2019) failed to calculate for the following reasons:   The 2019 ASCVD risk score is only valid for ages 50 to 19    Assessment & Plan:  Heart palpitations -     EKG 12-Lead -     LONG TERM MONITOR (3-14 DAYS); Future  High cholesterol   Assessment and Plan   Assessment & Plan Palpitations Palpitations occur at rest, not linked to anxiety or activity. Possible causes include nicotine use, atrial fibrillation, supraventricular tachycardia, and premature ventricular contractions. - EKG tracing is personally reviewed.  EKG notes NSR.  No acute changes.  No previous EKG to compare. - Order Zoll heart monitor for two weeks.  - Perform EKG for baseline. - Advise reduction in nicotine pouch use.  Hyperlipidemia LDL cholesterol slightly elevated at 114 mg/dL. Other lipid levels normal. - Advise regular exercise and dietary modifications to reduce cholesterol and fat intake.    Return if symptoms worsen or fail to improve.    Carrol Aurora, NP

## 2024-05-30 NOTE — Patient Instructions (Addendum)
 Someone will contact you regarding regarding the monitor. Let me know if you don't hear anything.   I will see you once the results come in.   It was a pleasure to see you today!

## 2024-06-03 ENCOUNTER — Telehealth: Payer: Self-pay | Admitting: General Practice

## 2024-06-03 NOTE — Telephone Encounter (Signed)
 Form done, faxed and patient aware. Patient would like to pick up copy as well on Monday and copy has been placed up front for pick up.

## 2024-06-03 NOTE — Telephone Encounter (Signed)
 Type of form received: Annual Wellness Completion form  Additional comments:  NA  Received by: Inocente Moats should be Faxed to: [phone]  Form should be mailed to:  NA  Is patient requesting call for pickup: Yes   Form placed:  Provider folder  Attach charge sheet. Yes

## 2024-06-07 ENCOUNTER — Other Ambulatory Visit

## 2024-06-09 ENCOUNTER — Ambulatory Visit
Admission: RE | Admit: 2024-06-09 | Discharge: 2024-06-09 | Disposition: A | Discharge status: Home / Self Care | Source: Ambulatory Visit | Attending: Otolaryngology | Admitting: Otolaryngology

## 2024-06-09 DIAGNOSIS — E042 Nontoxic multinodular goiter: Secondary | ICD-10-CM | POA: Diagnosis not present

## 2024-06-27 DIAGNOSIS — R002 Palpitations: Secondary | ICD-10-CM | POA: Diagnosis not present

## 2024-07-05 ENCOUNTER — Other Ambulatory Visit: Payer: Self-pay | Admitting: General Practice

## 2024-07-05 DIAGNOSIS — K219 Gastro-esophageal reflux disease without esophagitis: Secondary | ICD-10-CM

## 2024-07-06 ENCOUNTER — Ambulatory Visit: Attending: Cardiology | Admitting: Cardiology

## 2024-07-06 VITALS — BP 114/72 | HR 81 | Ht 65.0 in | Wt 194.4 lb

## 2024-07-06 DIAGNOSIS — R002 Palpitations: Secondary | ICD-10-CM | POA: Diagnosis not present

## 2024-07-06 NOTE — Patient Instructions (Signed)

## 2024-07-06 NOTE — Progress Notes (Signed)
 Cardiology Office Note:    Date:  07/06/2024   ID:  Katrina Payne, DOB August 14, 1990, MRN [id number]  PCP:  Vincente Shivers, NP   [facility] Providers Cardiologist:  None     Referring MD: Vincente Shivers, NP   Chief Complaint  Patient presents with   Establish Care    Palpitations. Denies chest pain and shortness of breath.   Katrina Payne is a 33 y.o. female who is being seen today for the evaluation of palpitations at the request of Vincente Shivers, NP.   History of Present Illness:    Katrina Payne is a 33 y.o. female with a hx of GERD, anxiety, former smoker x 15 years presenting with palpitations.  Endorses symptoms of palpitations ongoing over the past 6 months.  Symptoms typically last a few seconds occurring daily.  Denies dizziness, presyncope or syncope.  Drinks about 1 cup of coffee daily, denies any personal or family history of heart disease.  Follow-up with primary care physician, cardiac monitor was placed last month for 2 weeks revealing no sustained arrhythmias, rare PACs associated with patient triggered events.  Past Medical History:  Diagnosis Date   Depression    Heartburn    History of chicken pox    Prediabetes     No past surgical history on file.  Current Medications: Current Meds  Medication Sig   ferrous sulfate 325 (65 FE) MG tablet Take 325 mg by mouth daily with breakfast.   omeprazole  (PRILOSEC) 20 MG capsule TAKE 1 CAPSULE BY MOUTH EVERY DAY     Allergies:   Crab extract   Social History   Socioeconomic History   Marital status: Single    Spouse name: Not on file   Number of children: Not on file   Years of education: Not on file   Highest education level: 12th grade  Occupational History   Not on file  Tobacco Use   Smoking status: Every Day    Current packs/day: 0.50    Types: Cigarettes   Smokeless tobacco: Never   Tobacco comments:    Nicotine pouch daily  Vaping Use   Vaping status: Never Used  Substance and Sexual  Activity   Alcohol use: Not Currently   Drug use: No   Sexual activity: Not Currently  Other Topics Concern   Not on file  Social History Narrative   Not on file   Social Drivers of Health   Financial Resource Strain: Low Risk  (05/30/2024)   Overall Financial Resource Strain (CARDIA)    Difficulty of Paying Living Expenses: Not very hard  Food Insecurity: No Food Insecurity (05/30/2024)   Hunger Vital Sign    Worried About Running Out of Food in the Last Year: Never true    Ran Out of Food in the Last Year: Never true  Transportation Needs: No Transportation Needs (05/30/2024)   PRAPARE - Administrator, Civil Service (Medical): No    Lack of Transportation (Non-Medical): No  Physical Activity: Insufficiently Active (05/30/2024)   Exercise Vital Sign    Days of Exercise per Week: 3 days    Minutes of Exercise per Session: 20 min  Stress: No Stress Concern Present (05/30/2024)   [employer] of Occupational Health - Occupational Stress Questionnaire    Feeling of Stress: Only a little  Social Connections: Moderately Isolated (05/30/2024)   Social Connection and Isolation Panel    Frequency of Communication with Friends and Family: More than three times a week  Frequency of Social Gatherings with Friends and Family: Once a week    Attends Religious Services: Never    Database Administrator or Organizations: No    Attends Engineer, Structural: Not on file    Marital Status: Living with partner     Family History: The patient's family history includes Alcohol abuse in her father, maternal grandfather, and mother; COPD in her mother; Depression in her father and mother; Diabetes in her paternal grandfather; Drug abuse in her father; Hearing loss in her maternal grandfather; Heart attack in her maternal grandfather; Hyperlipidemia in her maternal grandfather and mother; Hypertension in her mother; Kidney Stones in her father; Stroke in her maternal grandfather  and paternal grandfather.  ROS:   Please see the history of present illness.     All other systems reviewed and are negative.  EKGs/Labs/Other Studies Reviewed:    The following studies were reviewed today:  EKG Interpretation Date/Time:  Wednesday July 06 2024 08:27:26 EST Ventricular Rate:  81 PR Interval:  156 QRS Duration:  76 QT Interval:  364 QTC Calculation: 422 R Axis:   -2  Text Interpretation: Normal sinus rhythm Cannot rule out Inferior infarct , age undetermined Confirmed by Darliss Rogue ([id number]) on 07/06/2024 8:50:57 AM    Recent Labs: 04/06/2024: Hemoglobin 14.2; Platelets 286.0; TSH 1.03  Recent Lipid Panel    Component Value Date/Time   CHOL 184 04/06/2024 0733   TRIG 115.0 04/06/2024 0733   HDL 47.50 04/06/2024 0733   CHOLHDL 4 04/06/2024 0733   VLDL 23.0 04/06/2024 0733   LDLCALC 114 (H) 04/06/2024 0733     Risk Assessment/Calculations:              Physical Exam:    VS:  BP 114/72 (BP Location: Left Arm, Patient Position: Sitting, Cuff Size: Normal)   Pulse 81   Ht 5' 5 (1.651 m)   Wt 194 lb 6.4 oz (88.2 kg)   SpO2 99%   BMI 32.35 kg/m     Wt Readings from Last 3 Encounters:  07/06/24 194 lb 6.4 oz (88.2 kg)  05/30/24 189 lb (85.7 kg)  04/04/24 186 lb 12.8 oz (84.7 kg)     GEN:  Well nourished, well developed in no acute distress HEENT: Normal NECK: No JVD; No carotid bruits CARDIAC: RRR, no murmurs, rubs, gallops RESPIRATORY:  Clear to auscultation without rales, wheezing or rhonchi  ABDOMEN: Soft, non-tender, non-distended MUSCULOSKELETAL:  No edema; No deformity  SKIN: Warm and dry NEUROLOGIC:  Alert and oriented x 3 PSYCHIATRIC:  Normal affect   ASSESSMENT:    1. Palpitations    PLAN:    In order of problems listed above:  Palpitations, patient triggered events associated with rare PACs.  No significant or sustained arrhythmias.  Overall benign cardiac monitor.  Patient made aware of results, reassured.   Cutting back on caffeinated products, coffee may help.  Follow-up as needed     Medication Adjustments/Labs and Tests Ordered: Current medicines are reviewed at length with the patient today.  Concerns regarding medicines are outlined above.  Orders Placed This Encounter  Procedures   EKG 12-Lead   No orders of the defined types were placed in this encounter.   Patient Instructions  Medication Instructions:  Your physician recommends that you continue on your current medications as directed. Please refer to the Current Medication list given to you today.   *If you need a refill on your cardiac medications before your next appointment, please call  your pharmacy*  Lab Work: No labs ordered today  If you have labs (blood work) drawn today and your tests are completely normal, you will receive your results only by: MyChart Message (if you have MyChart) OR A paper copy in the mail If you have any lab test that is abnormal or we need to change your treatment, we will call you to review the results.  Testing/Procedures: No test ordered today   Follow-Up: At [facility], you and your health needs are our priority.  As part of our continuing mission to provide you with exceptional heart care, our providers are all part of one team.  This team includes your primary Cardiologist (physician) and Advanced Practice Providers or APPs (Physician Assistants and Nurse Practitioners) who all work together to provide you with the care you need, when you need it.  Your next appointment:   As needed            Signed, Redell Cave, MD  07/06/2024 9:51 AM    [facility]
